# Patient Record
Sex: Male | Born: 1950 | Race: White | Hispanic: No | Marital: Married | Smoking: Never smoker
Health system: Southern US, Academic
[De-identification: ages and names within clinical notes are randomized; demographics above are authoritative.]

## PROBLEM LIST (undated history)

## (undated) DIAGNOSIS — I10 Essential (primary) hypertension: Secondary | ICD-10-CM

## (undated) DIAGNOSIS — Z889 Allergy status to unspecified drugs, medicaments and biological substances status: Secondary | ICD-10-CM

## (undated) DIAGNOSIS — I252 Old myocardial infarction: Secondary | ICD-10-CM

## (undated) DIAGNOSIS — K219 Gastro-esophageal reflux disease without esophagitis: Secondary | ICD-10-CM

## (undated) DIAGNOSIS — E7800 Pure hypercholesterolemia, unspecified: Secondary | ICD-10-CM

## (undated) HISTORY — PX: HX HERNIA REPAIR: SHX51

---

## 1992-05-28 ENCOUNTER — Other Ambulatory Visit (HOSPITAL_COMMUNITY): Payer: Self-pay | Admitting: EXTERNAL

## 2017-05-16 IMAGING — CR XRAY HAND COMPLETE LT
1 series · 3 of 3 positions shown · non-contrast
Comparison: None.

EXAM:  XRAY HAND COMPLETE LT 3V

EXAM: XRAY EXAM OF WRIST COMPLETE RT 3v
EXAM: XRAY WRIST COMPLETE LT 3v 
EXAM: XRAY HAND COMPLETE RT 3V
INDICATION: Pain.

[Series 1: view not recorded · 0.17mm/px · 3 of 3 slices shown]
[im 1/3]
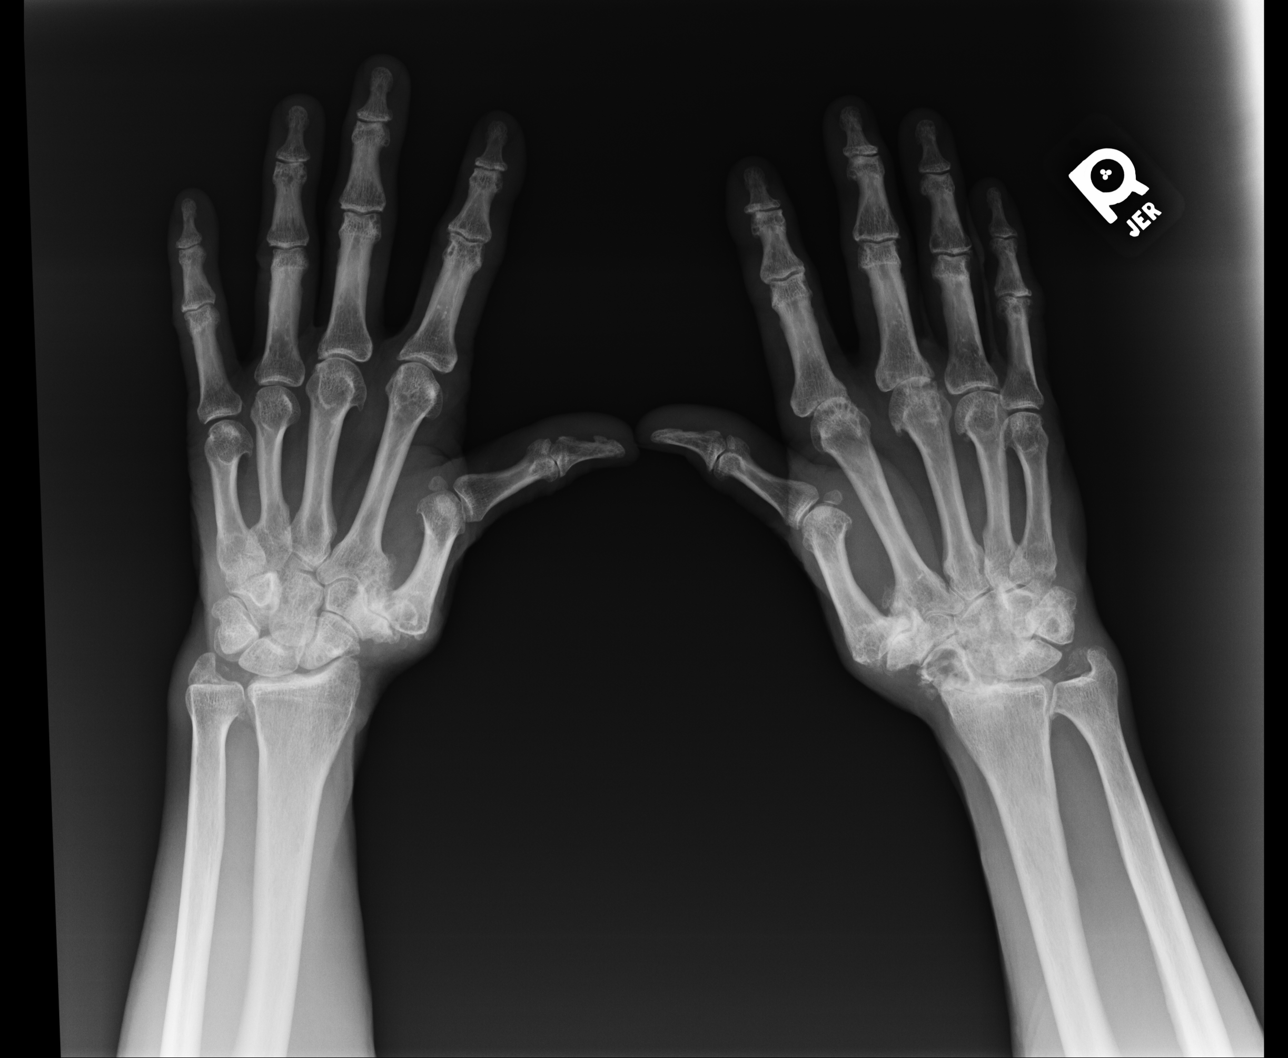
[im 2/3]
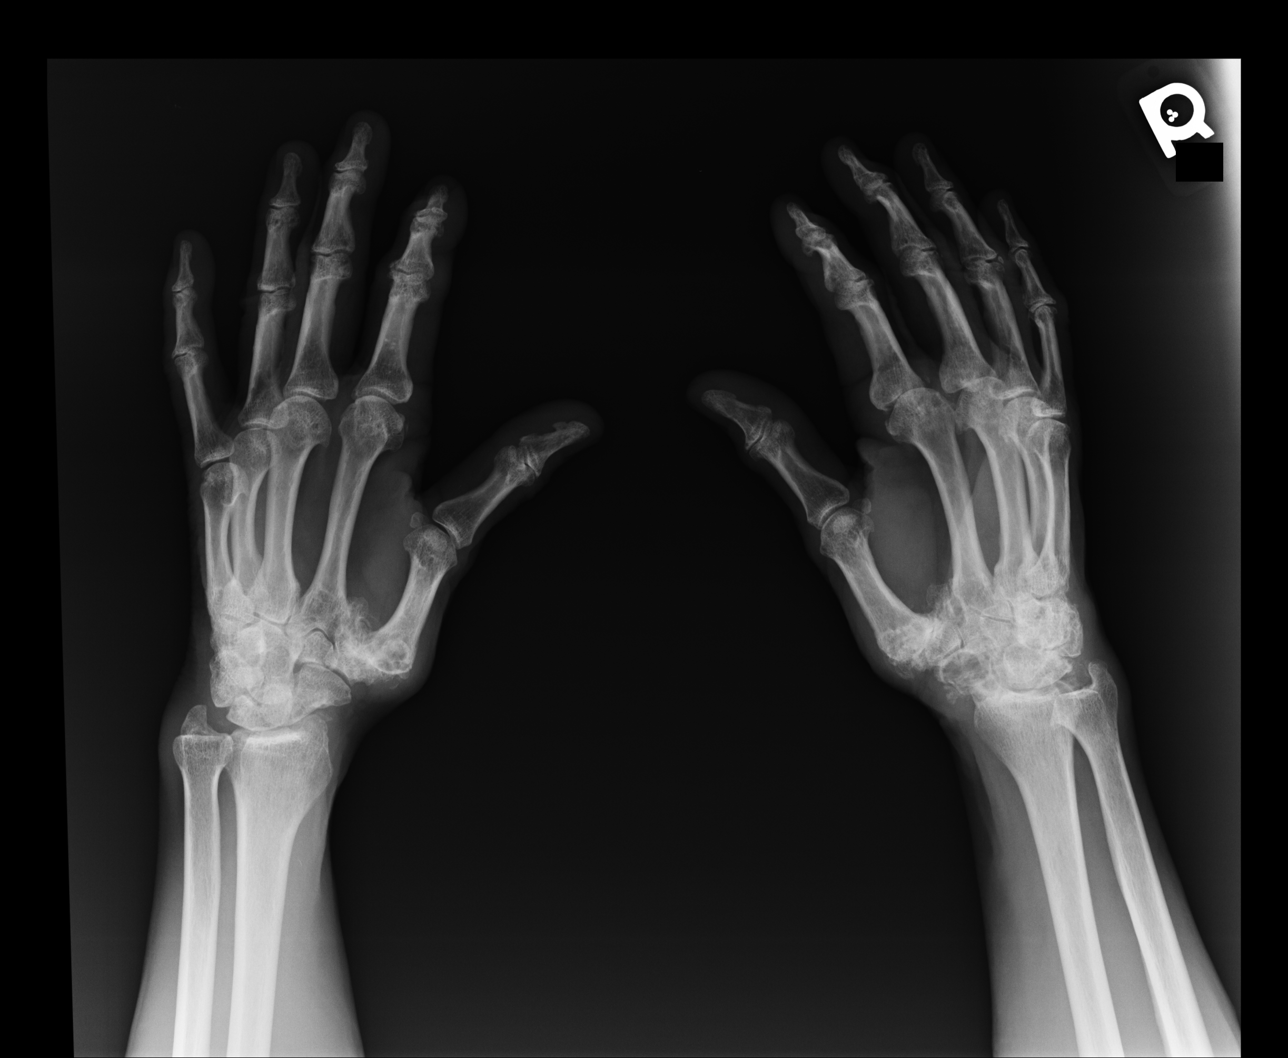
[im 3/3]
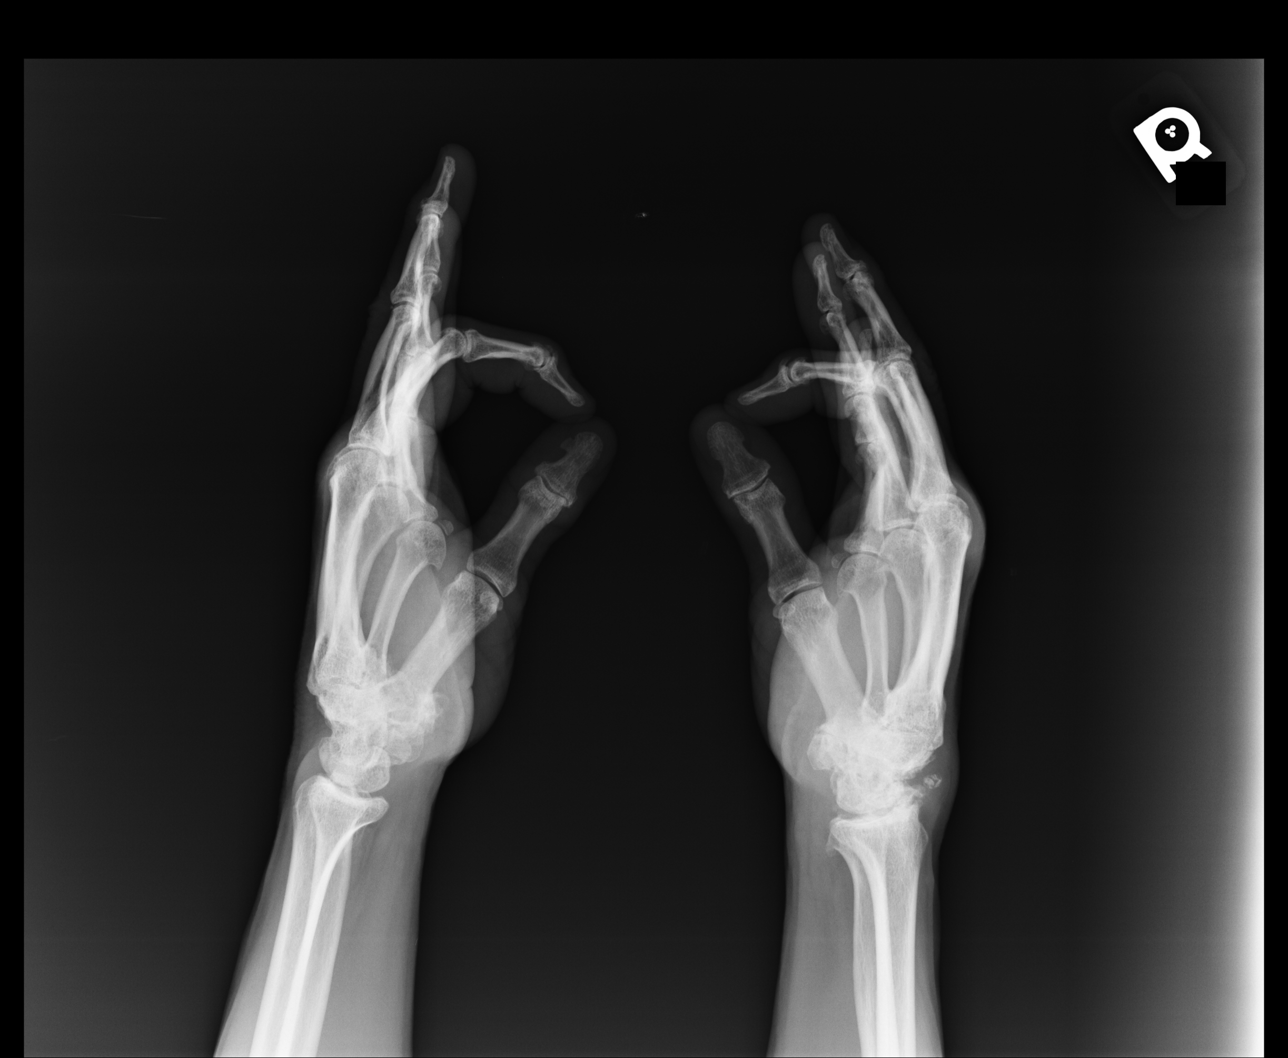

[3 of 3 positions shown; findings below may reference images not displayed]

FINDINGS: There is no acute fracture or subluxation. There is severe osteoarthritis of the right wrist joint. Severe osteoarthritis of the first carpometacarpal joints is also seen bilaterally. There is also advanced osteoarthritis of the right second through fourth metacarpophalangeal joints. Mild osteoarthritis of the left second and third metacarpophalangeal joints is also seen. There is no soft tissue abnormality. Faint calcifications of the triangular fibrocartilage are suggestive of chondrocalcinosis.
IMPRESSION: Degenerative changes as detailed above, no acute osseous abnormality.

## 2017-05-16 IMAGING — CR XRAY FOOT COMPLETE LT
1 series · 6 of 6 positions shown · non-contrast
Comparison: None.

EXAM:  XRAY FOOT COMPLETE LT 3V

EXAM: XRAY FOOT COMPLETE RT  3V
INDICATION: Pain.

[Series 1: view not recorded · 0.17mm/px · 6 of 6 slices shown]
[im 1/6]
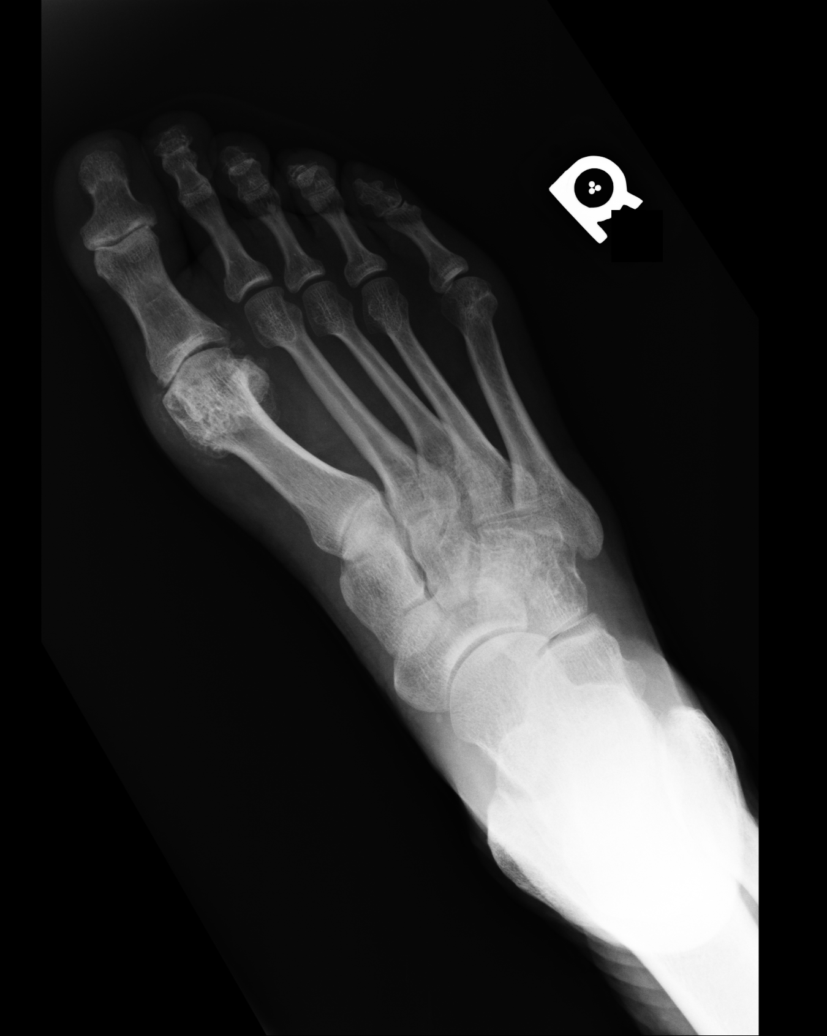
[im 2/6]
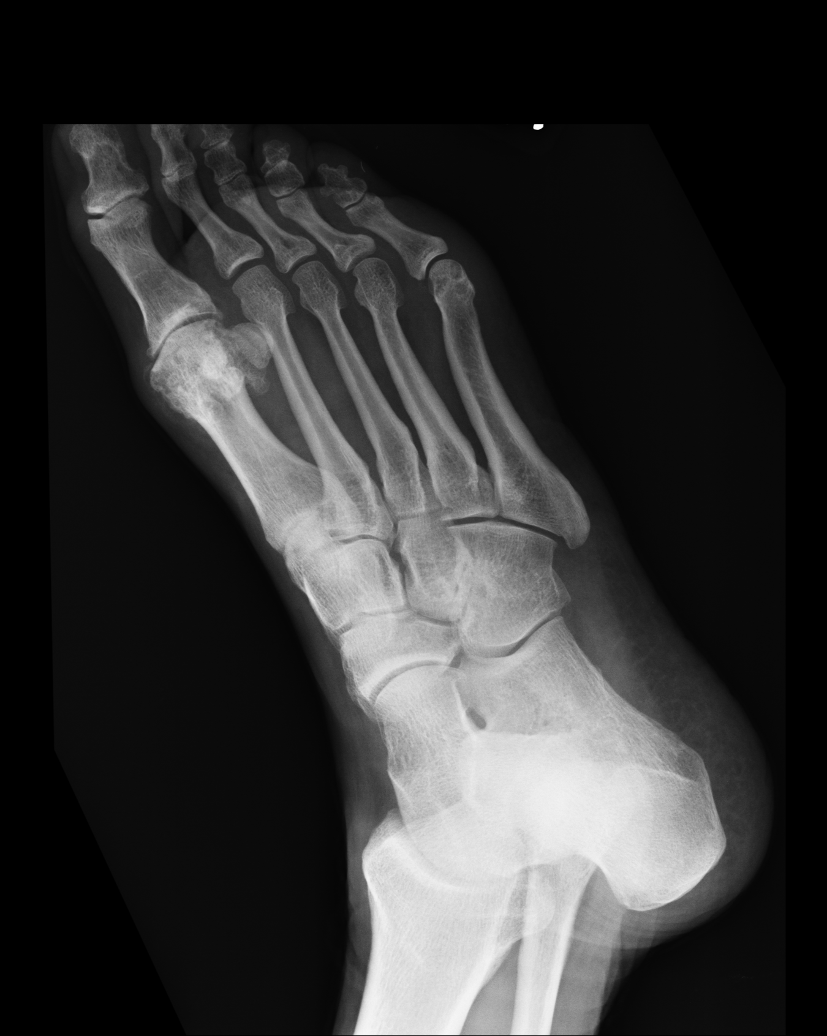
[im 3/6]
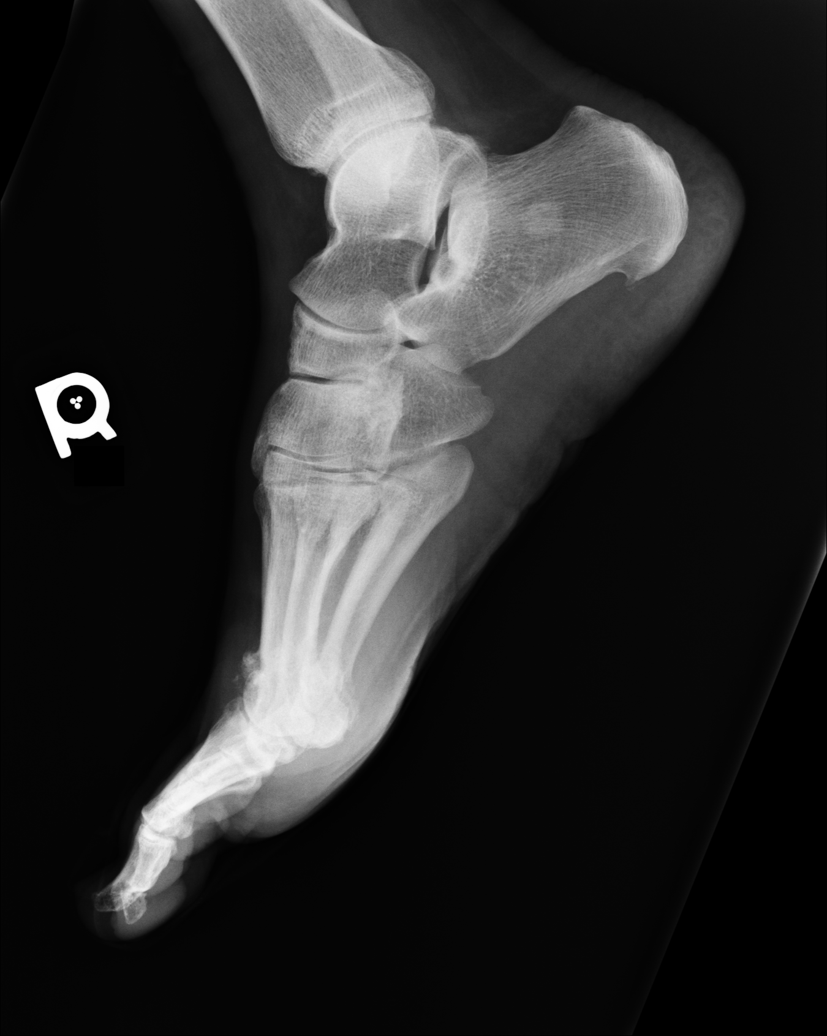
[im 4/6]
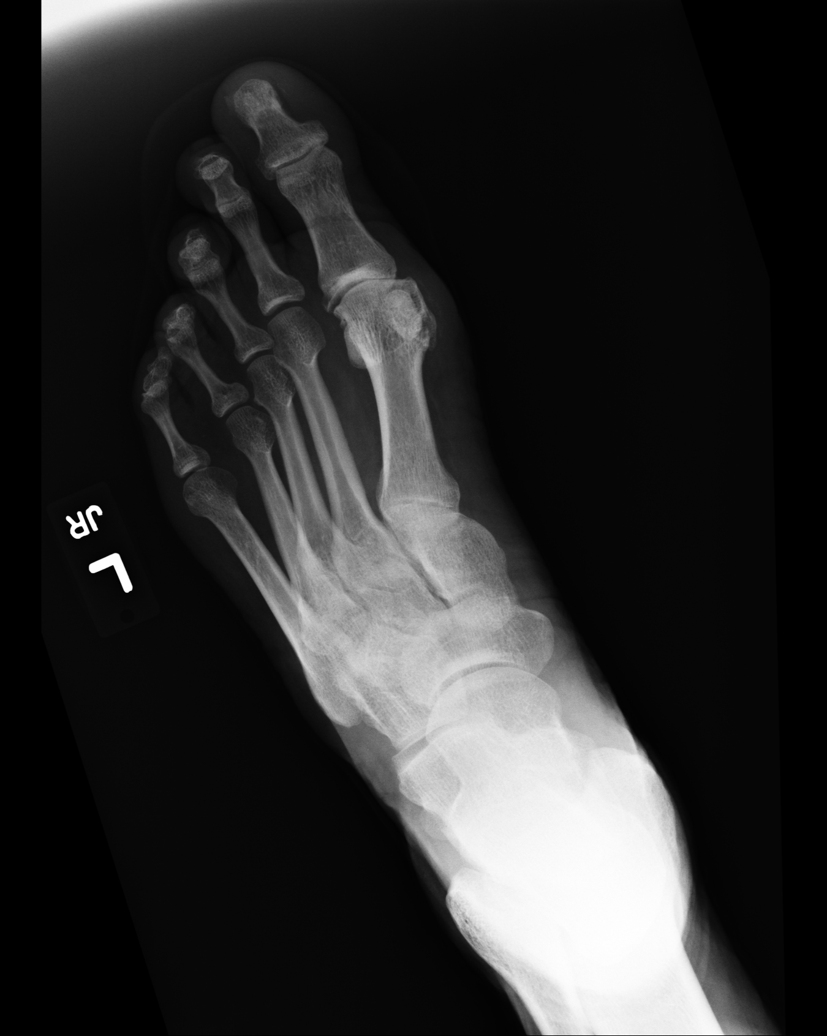
[im 5/6]
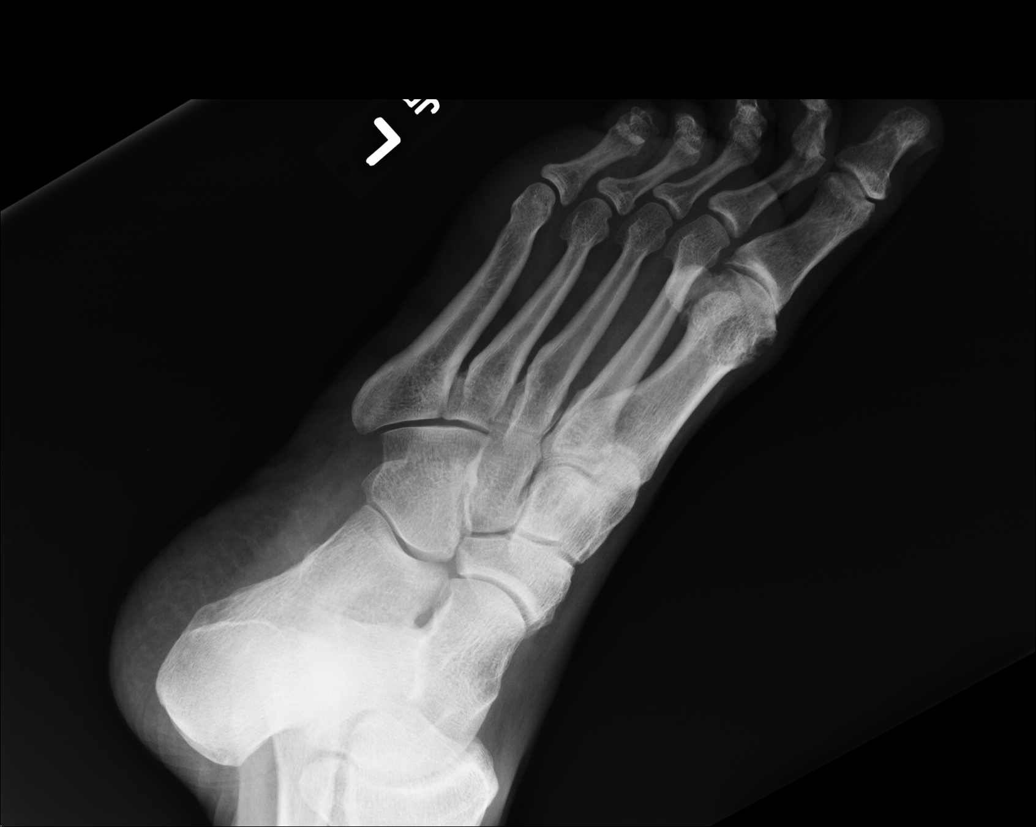
[im 6/6]
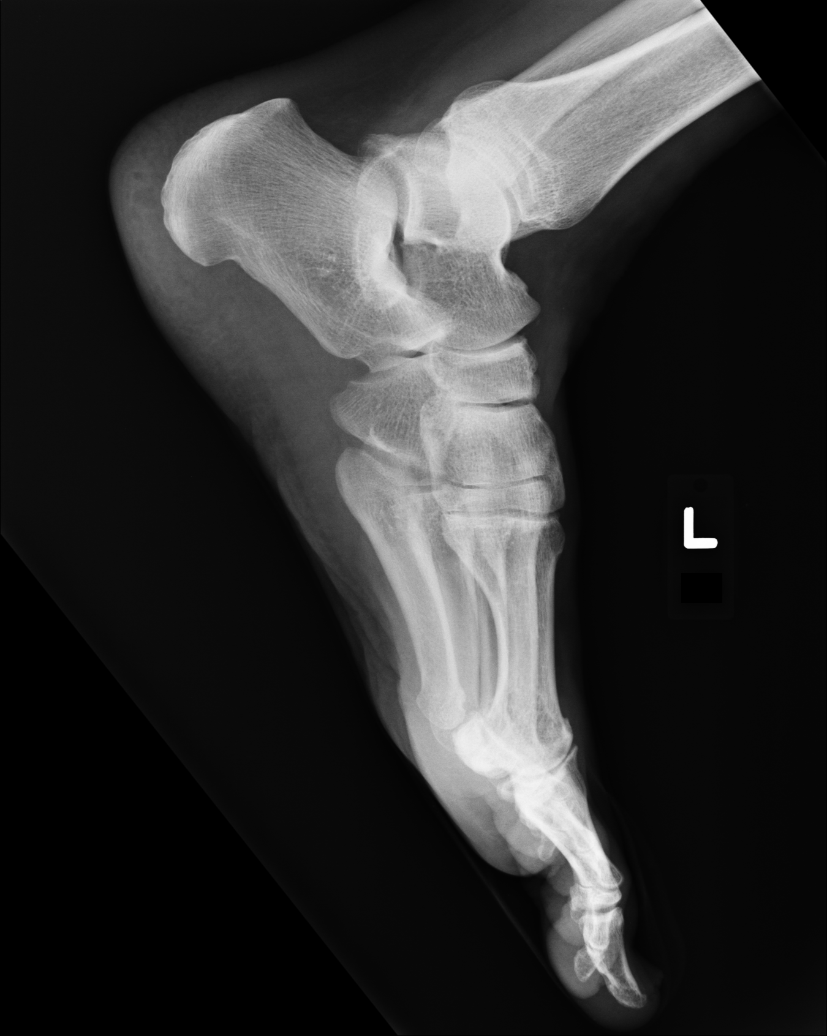

[6 of 6 positions shown; findings below may reference images not displayed]

FINDINGS: There is no acute fracture or subluxation. There is moderate osteoarthritis of the first metatarsophalangeal joints bilaterally. Mild midfoot osteoarthritis is also seen on both sides. There is no soft tissue abnormality.
IMPRESSION: Osteoarthritis of both feet as detailed above.

## 2017-05-16 IMAGING — CR XRAY KNEE [DATE] VIEWS LT
1 series · 3 of 3 positions shown · non-contrast
Comparison: None.

EXAM:  XRAY KNEE [DATE] VIEWS LT

EXAM:
 XRAY KNEE [DATE] VIEWS RT
INDICATION: Pain.

[Series 1: view not recorded · 0.17mm/px · 3 of 3 slices shown]
[im 1/3]
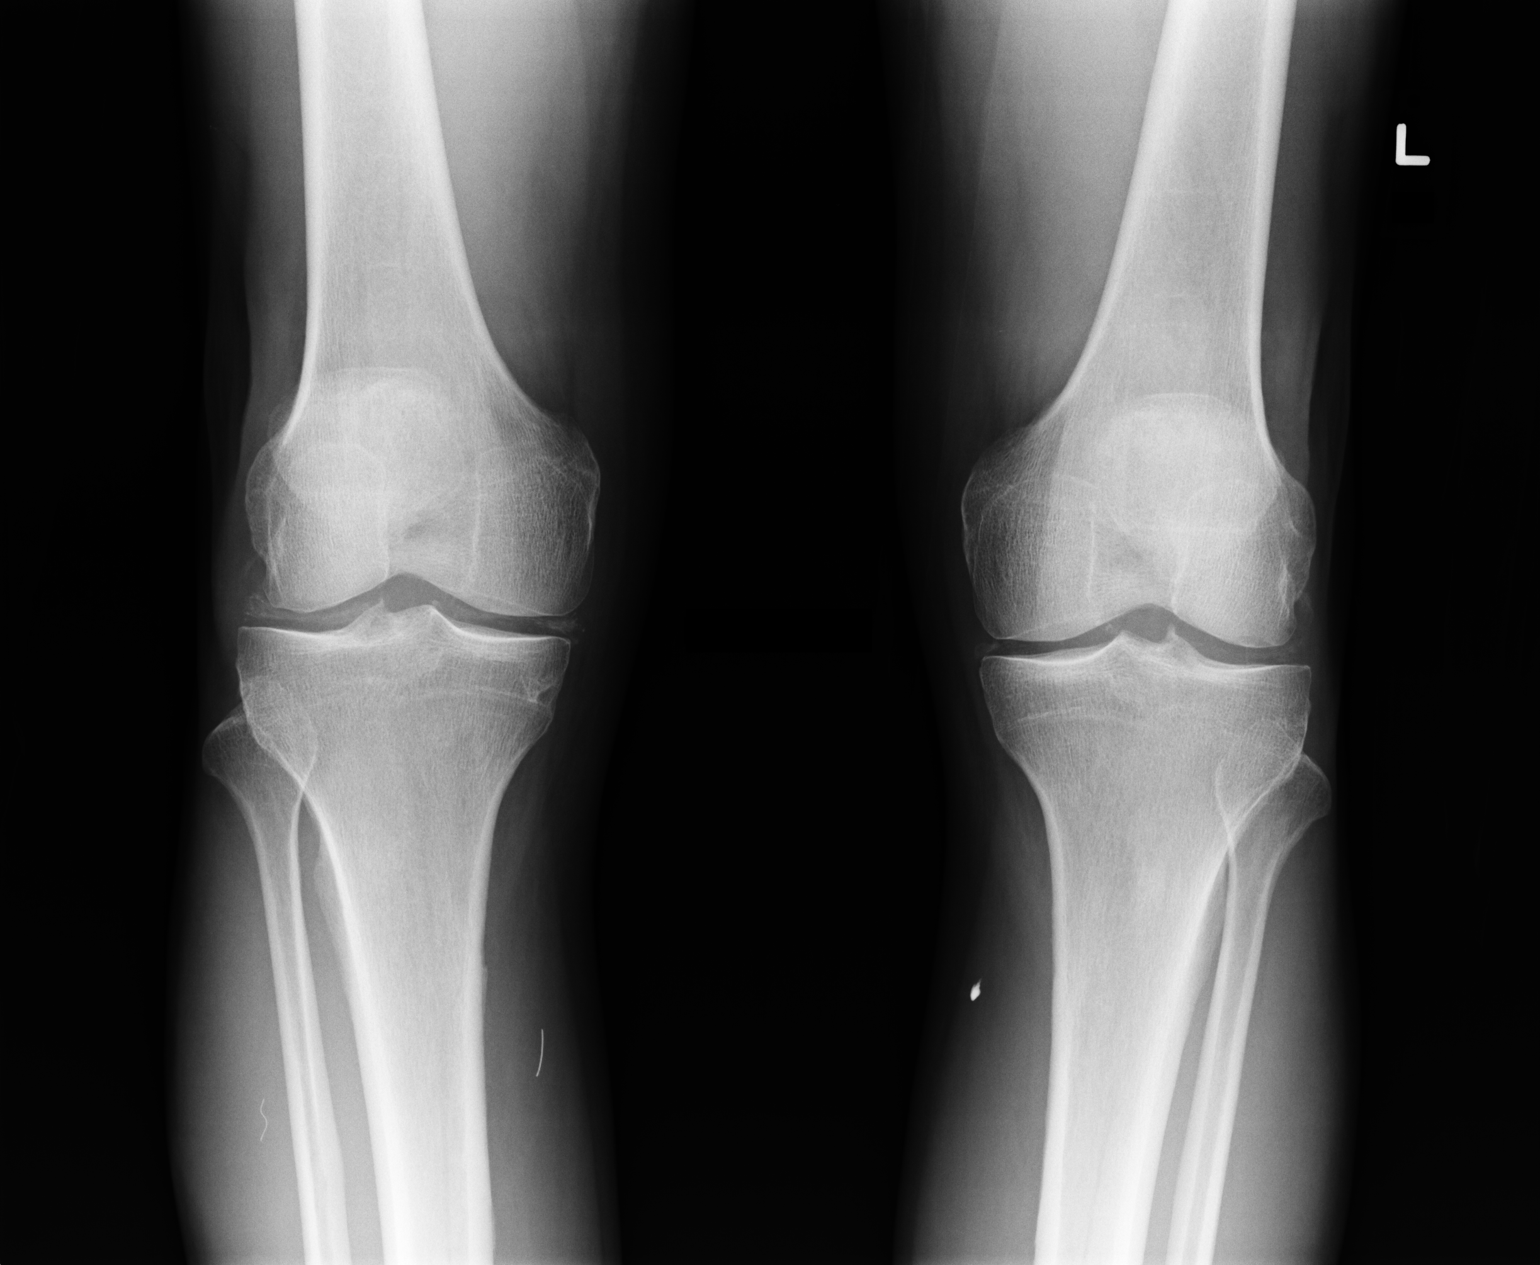
[im 2/3]
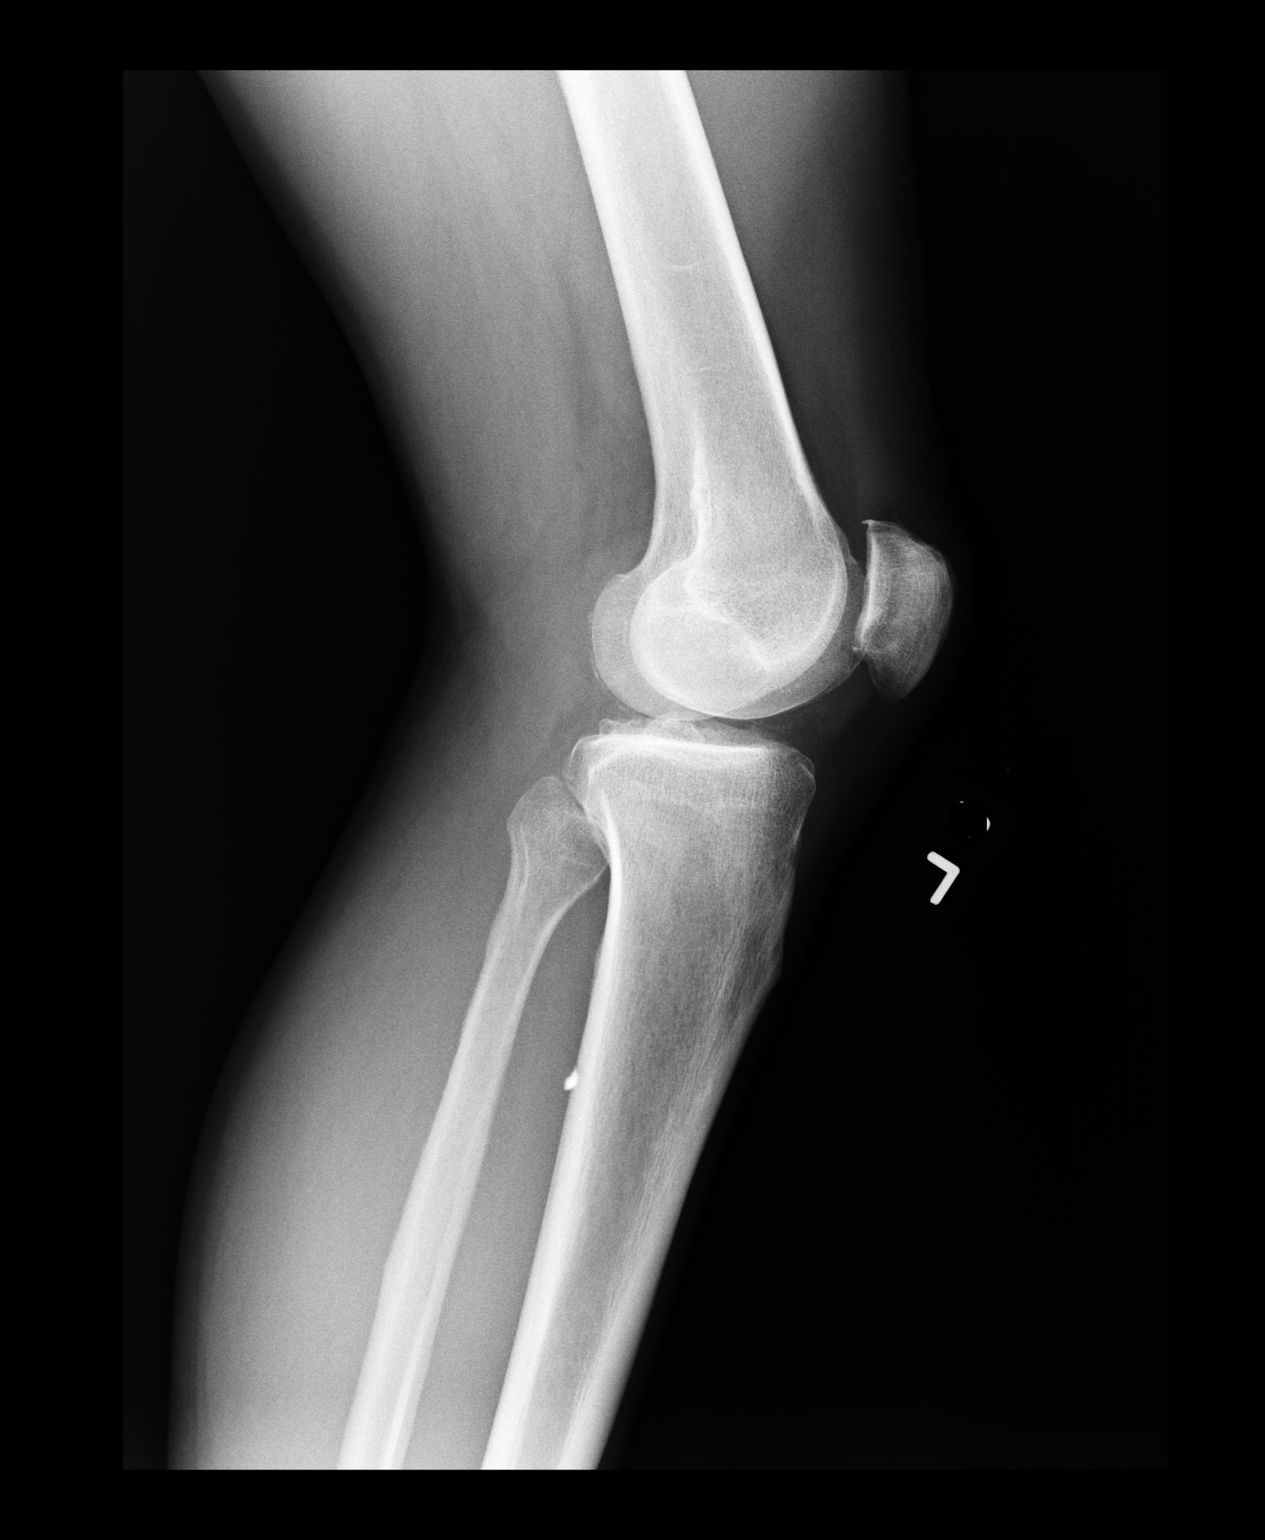
[im 3/3]
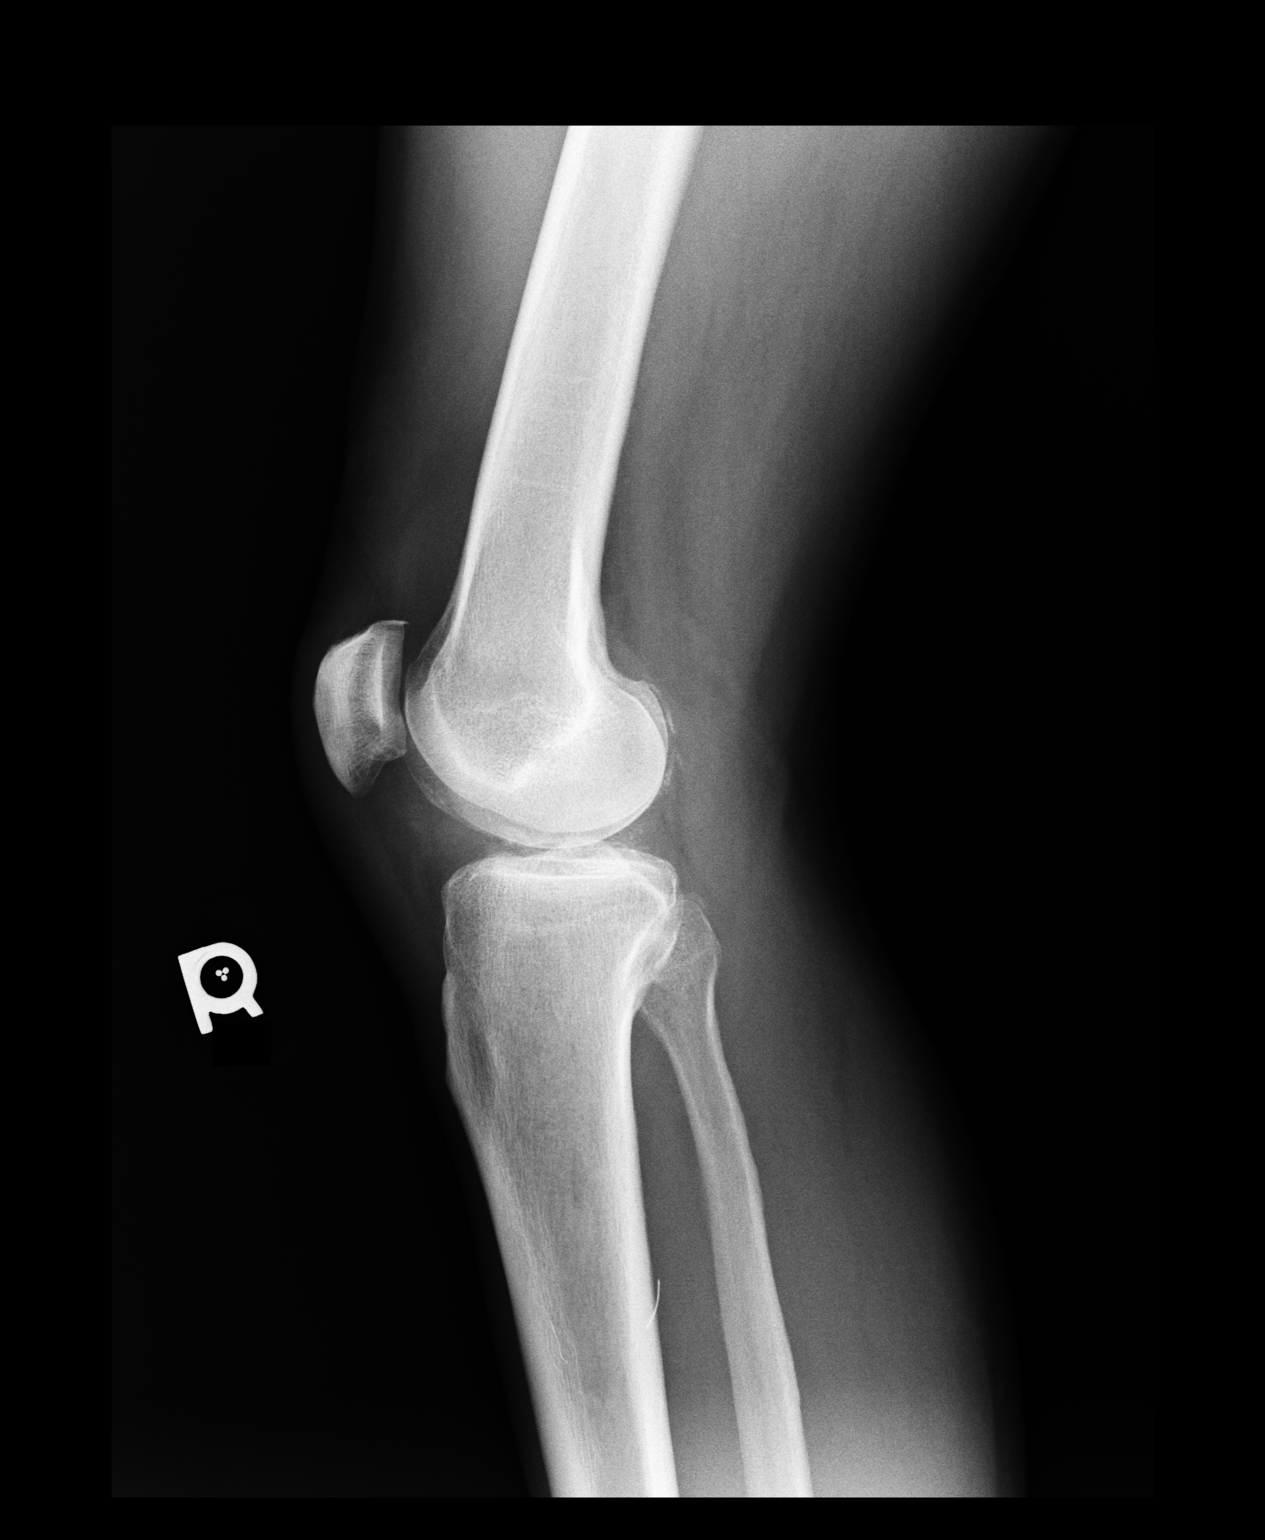

[3 of 3 positions shown; findings below may reference images not displayed]

FINDINGS: There is no acute fracture or subluxation. Joint spaces are well maintained bilaterally. Meniscal calcifications are seen on both sides consistent with chondrocalcinosis. There is no suprapatellar effusion on either side. There is suggestion of subcentimeter metallic foreign bodies within the soft tissues of both proximal calf’s. There is no suprapatellar effusion on either side.
IMPRESSION: Chondrocalcinosis, no significant joint space narrowing. 

Suggestion of subcentimeter metallic foreign bodies within soft tissues of both calf’s.

## 2017-05-16 IMAGING — CR XRAY LUMBAR SPINE COMPLETE
1 series · 4 of 4 positions shown · non-contrast
Comparison: None.

EXAM:  XRAY LUMBAR SPINE COMPLETE 4V
INDICATION: Back pain.

[Series 1: view not recorded · 0.17mm/px · 4 of 4 slices shown]
[im 1/4]
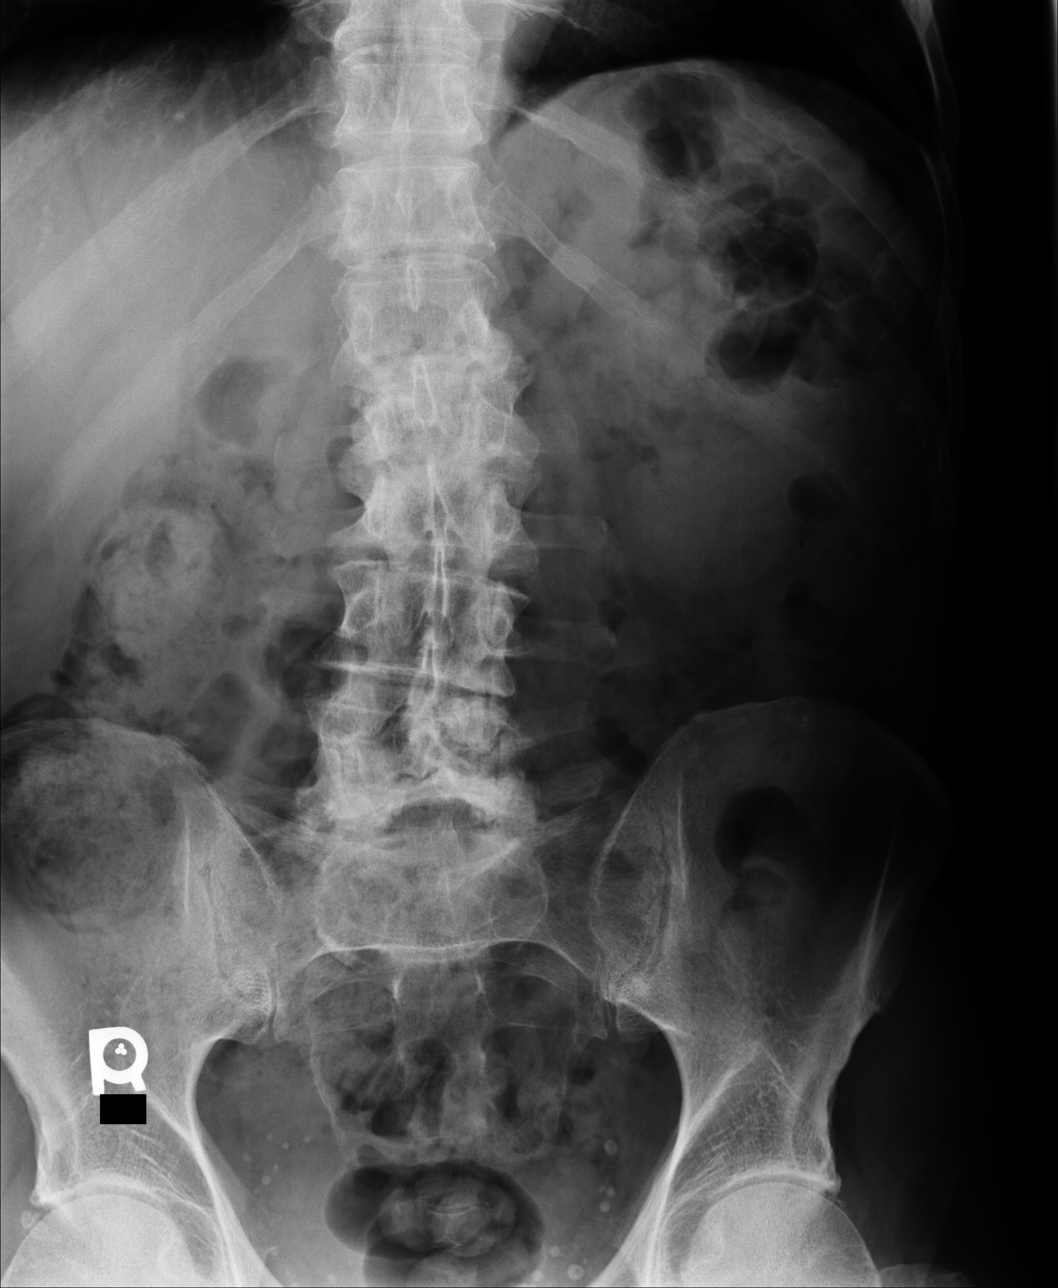
[im 2/4]
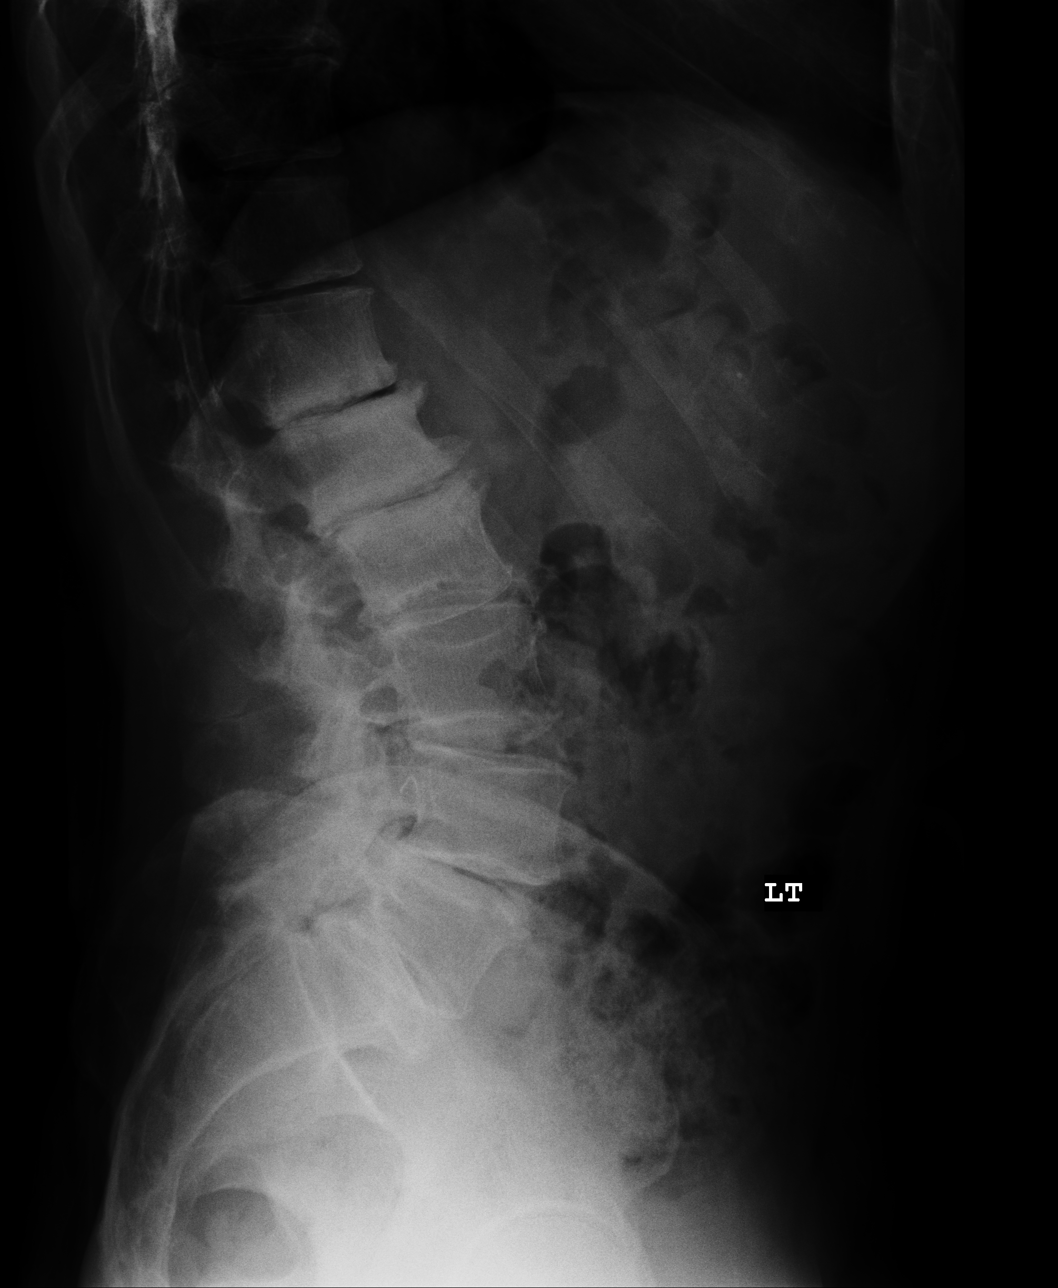
[im 3/4]
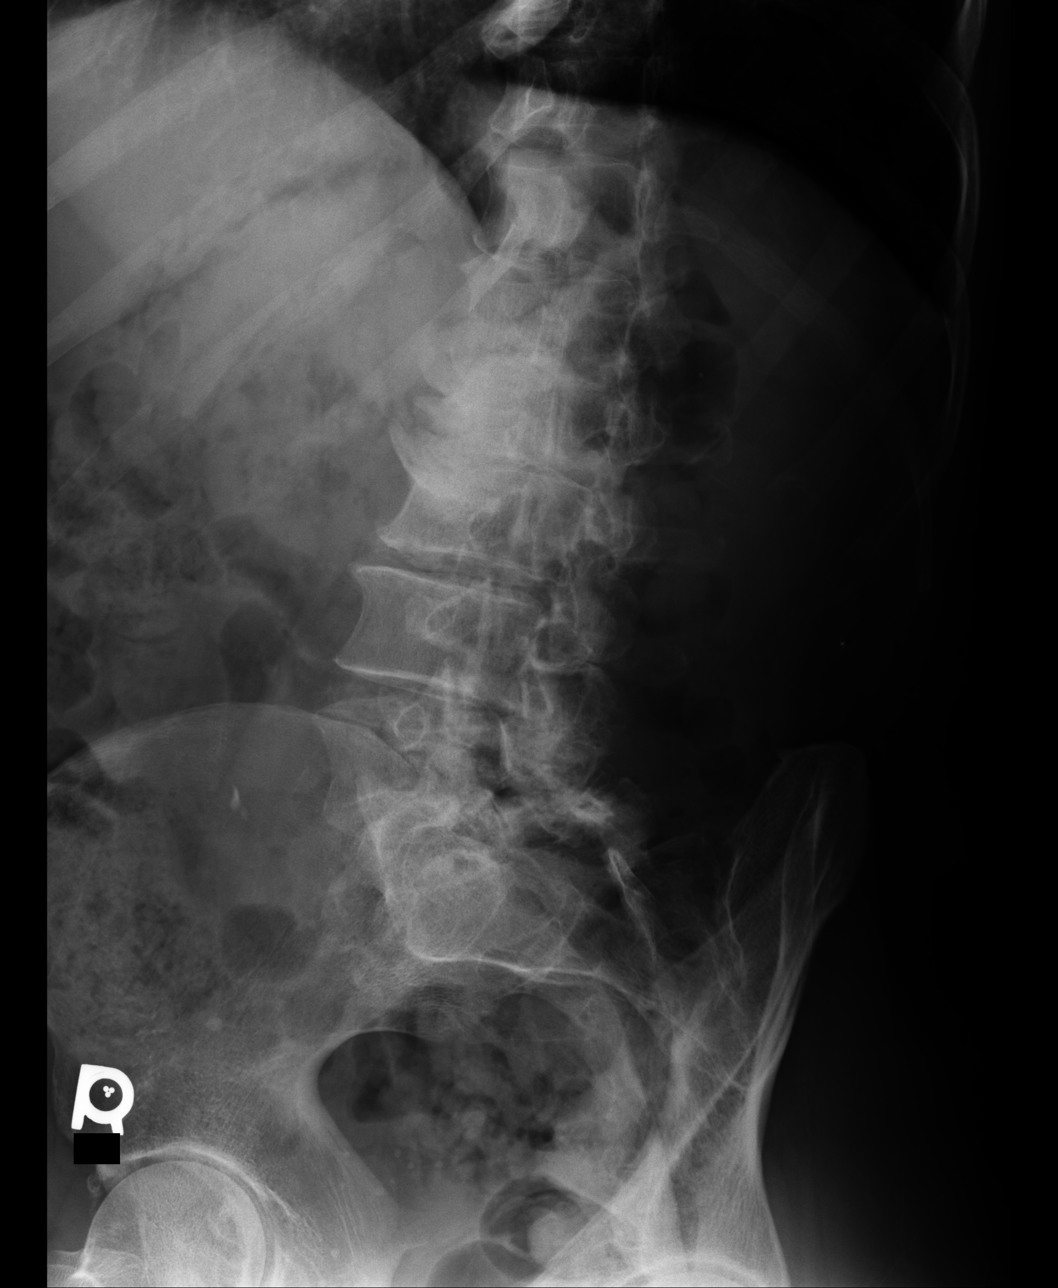
[im 4/4]
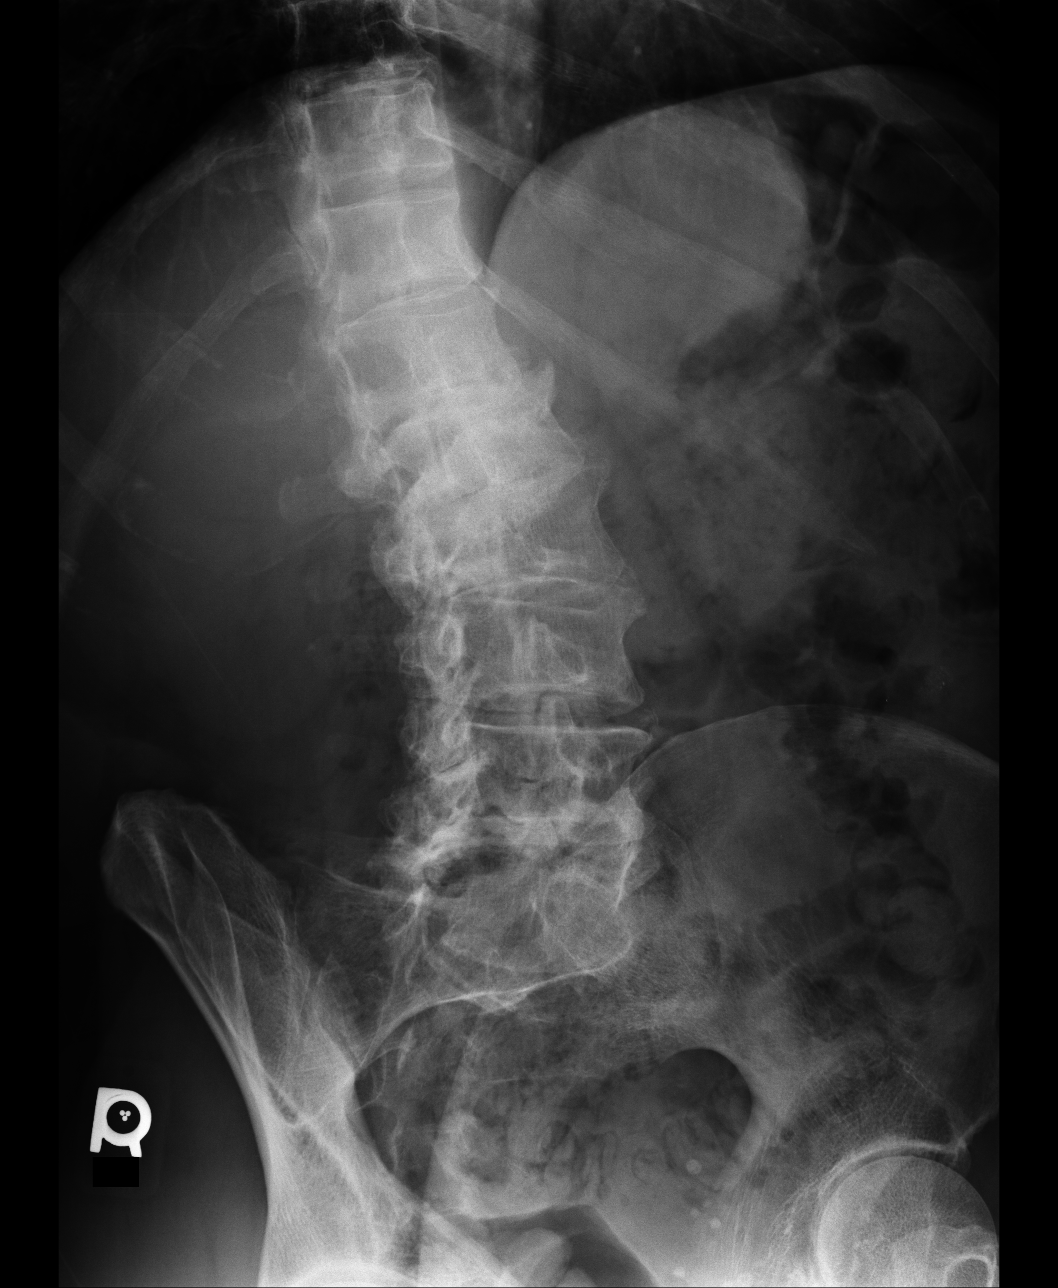

[4 of 4 positions shown; findings below may reference images not displayed]

FINDINGS: There is no acute fracture or subluxation. Severe degenerative disc disease is seen at most levels within the lumbar spine. There is also extensive facet arthropathy within the lower lumbar spine. There is no definite pars defect on the oblique views. Paraspinal soft tissues are unremarkable.
IMPRESSION: Extensive multi level degenerative changes as detailed above.

## 2017-05-16 IMAGING — CR XRAY ELBOW COMPLETE LT
1 series · 3 of 3 positions shown · non-contrast
Comparison: None.

EXAM:  XRAY ELBOW COMPLETE LT 3V
INDICATION: Pain.

[Series 1: view not recorded · 0.17mm/px · 3 of 3 slices shown]
[im 1/3]
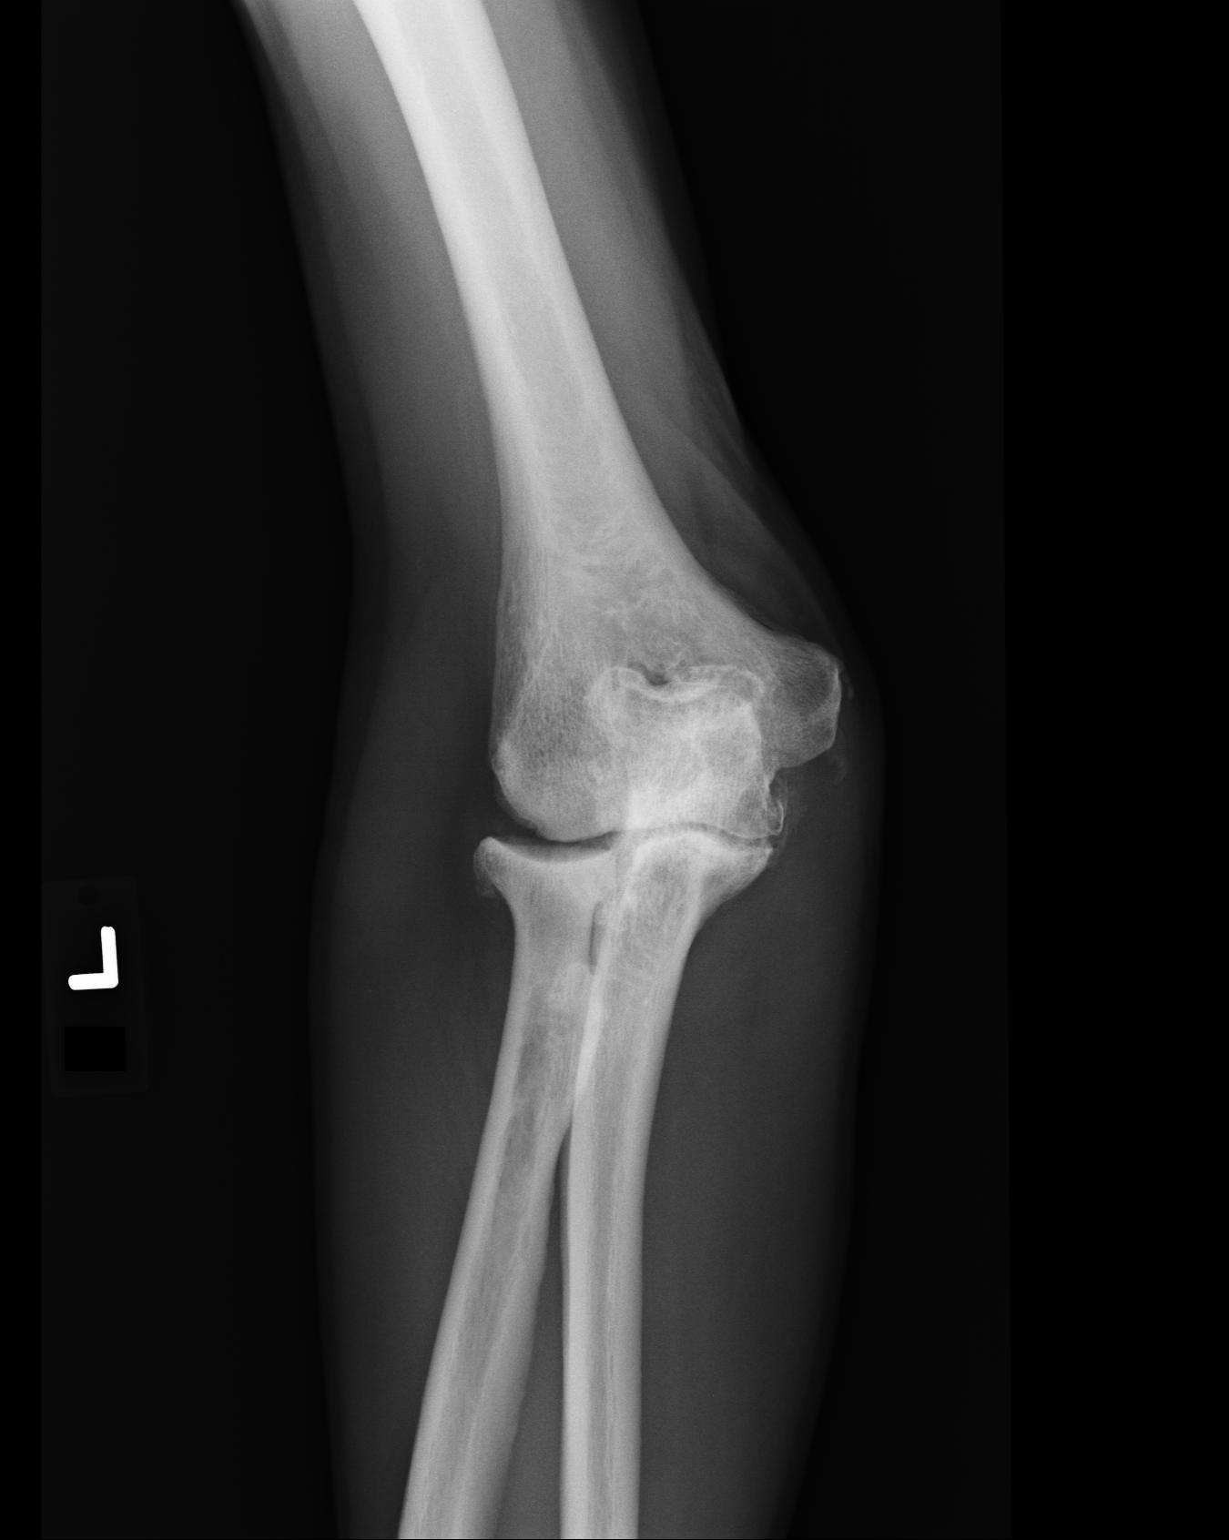
[im 2/3]
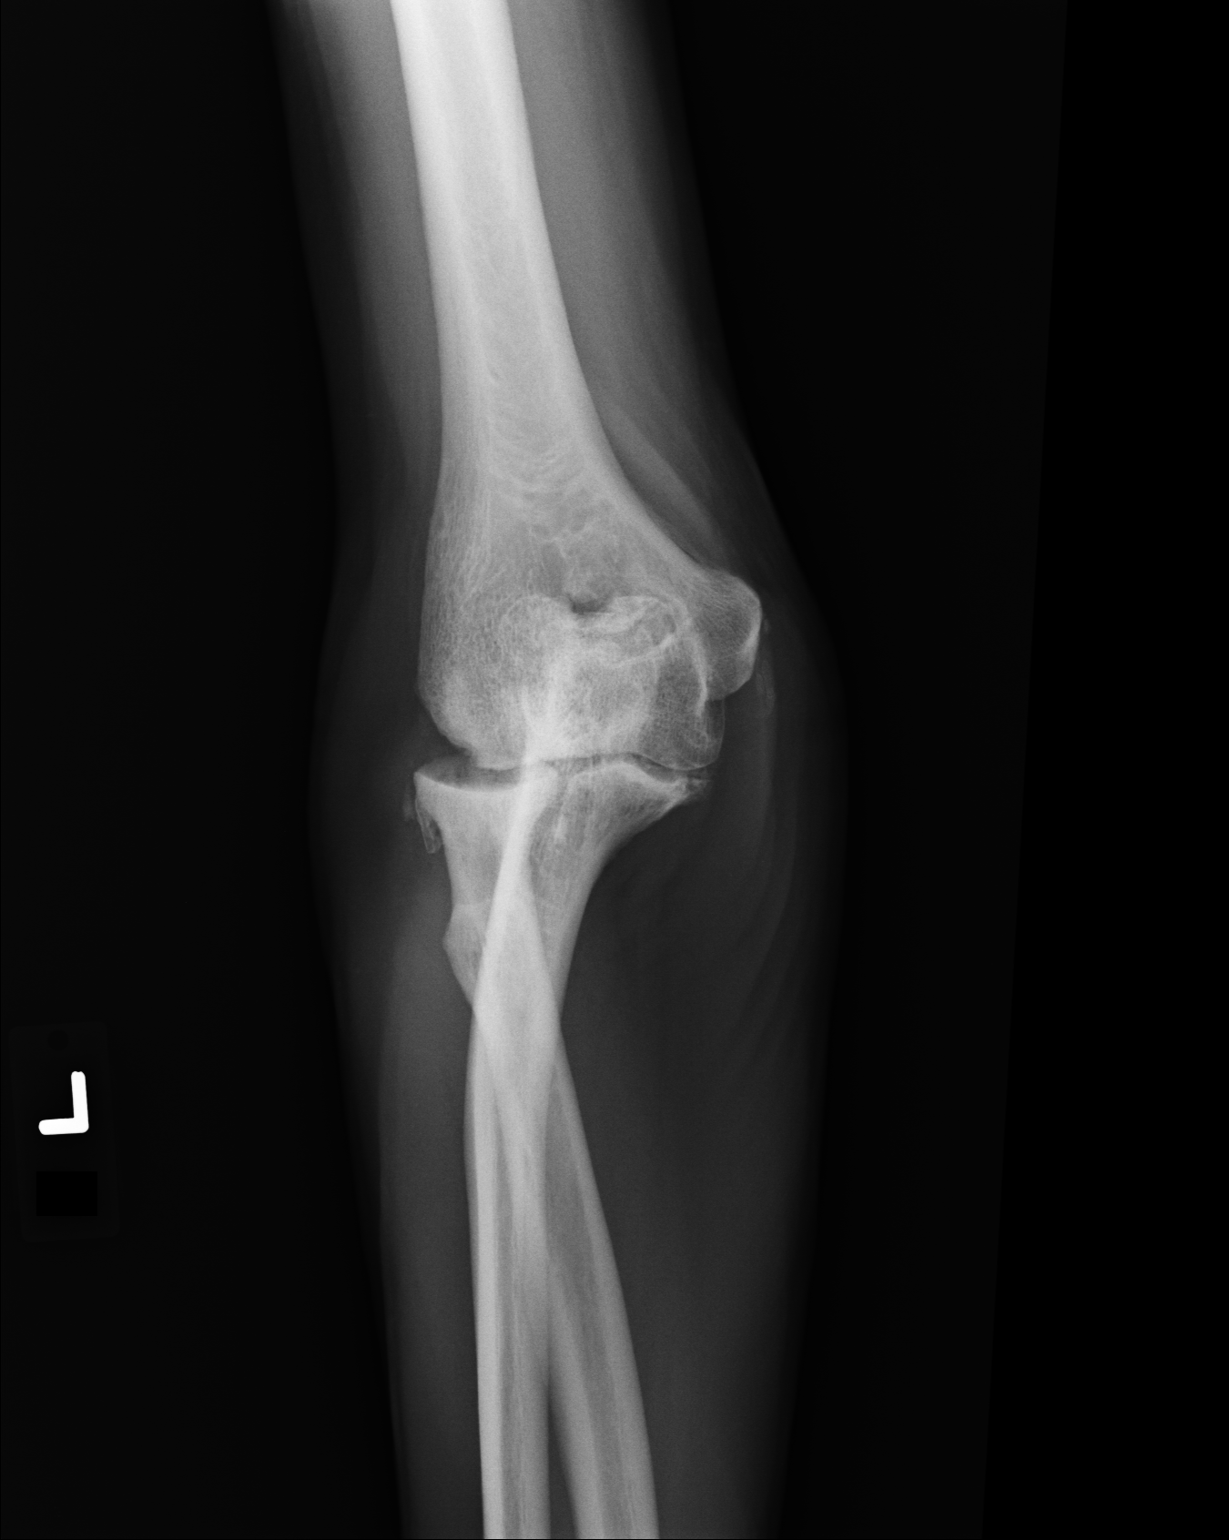
[im 3/3]
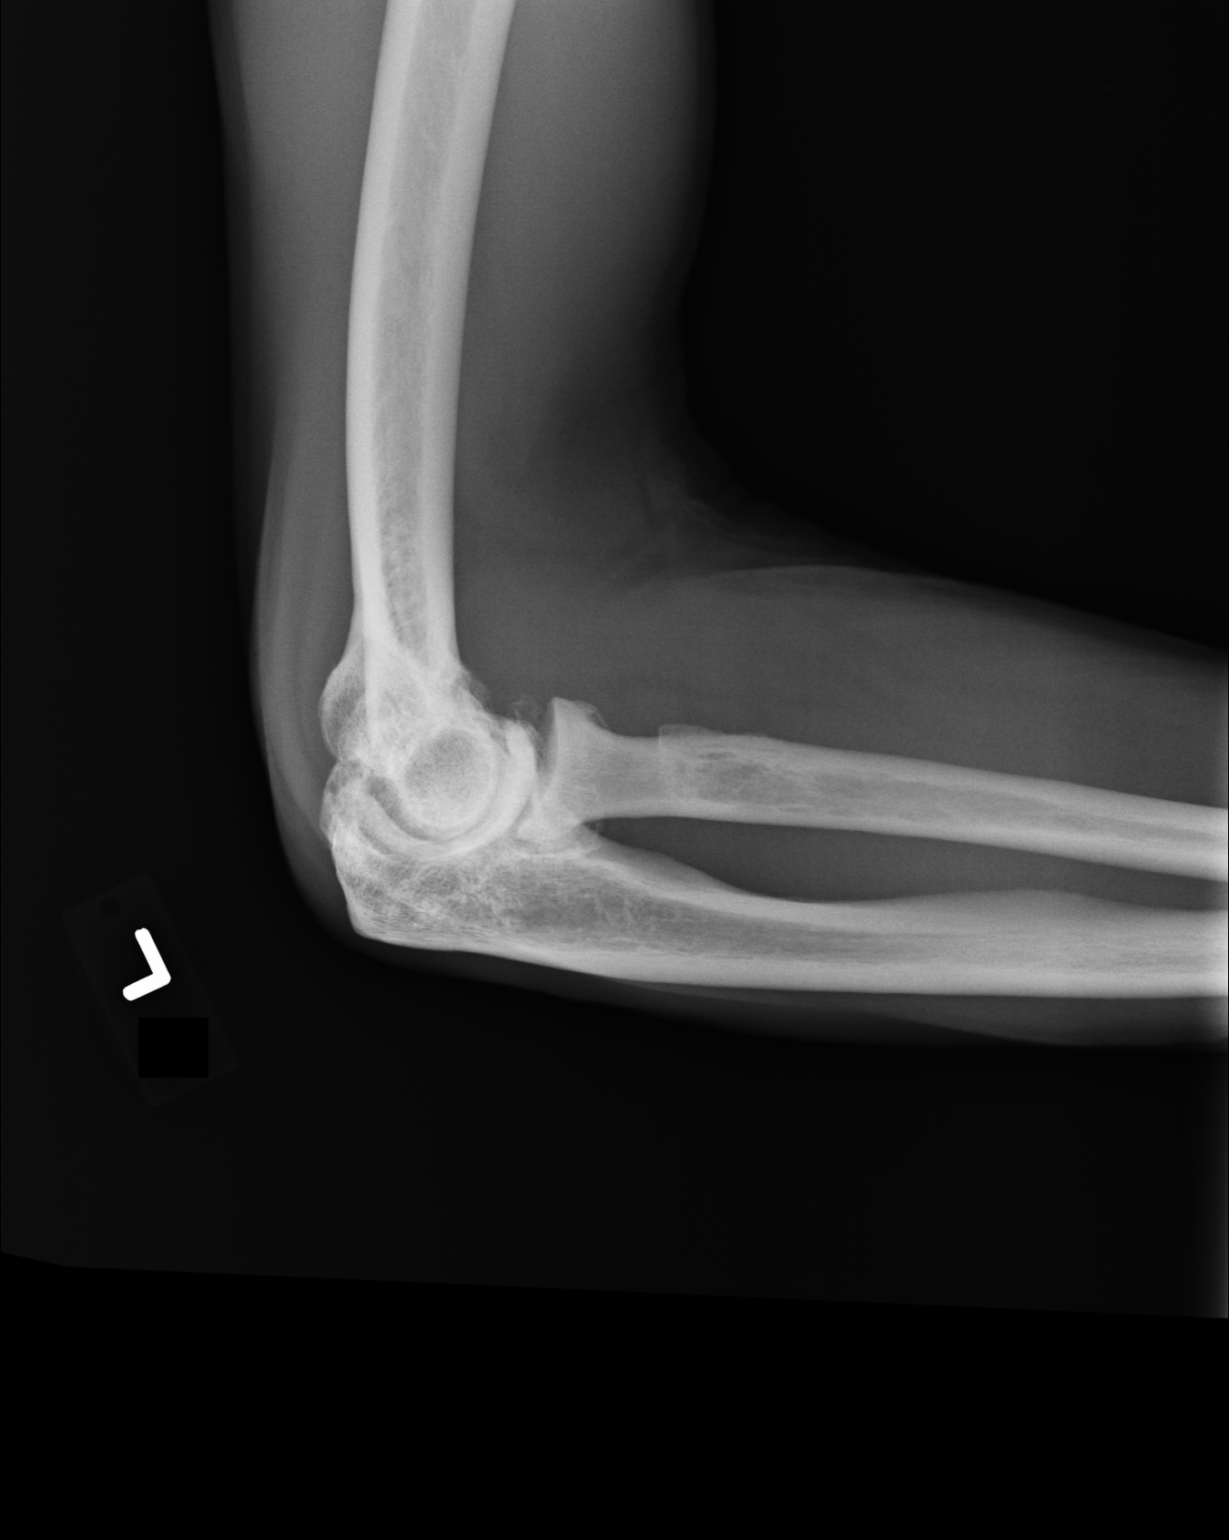

[3 of 3 positions shown; findings below may reference images not displayed]

FINDINGS: There is no acute fracture or subluxation. There is moderate osteoarthritis of the elbow joint with a small associated joint effusion. Surrounding soft tissues are unremarkable.
IMPRESSION: Moderate osteoarthritis, no acute osseous abnormality.

## 2017-05-16 IMAGING — CR XRAY CERVICAL SPINE MINIMUM 4 VIEWS
1 series · 8 of 8 positions shown · non-contrast
Comparison: None.

EXAM:  XRAY CERVICAL SPINE 5 VIEWS
INDICATION: Pain.

[Series 1: view not recorded · 0.17mm/px · 8 of 8 slices shown]
[im 1/8]
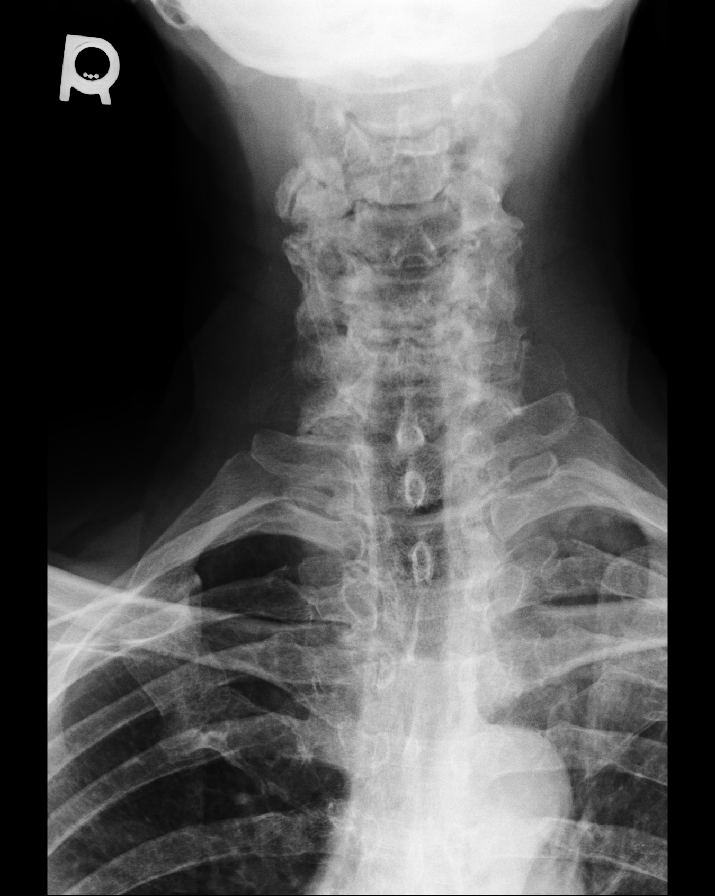
[im 2/8]
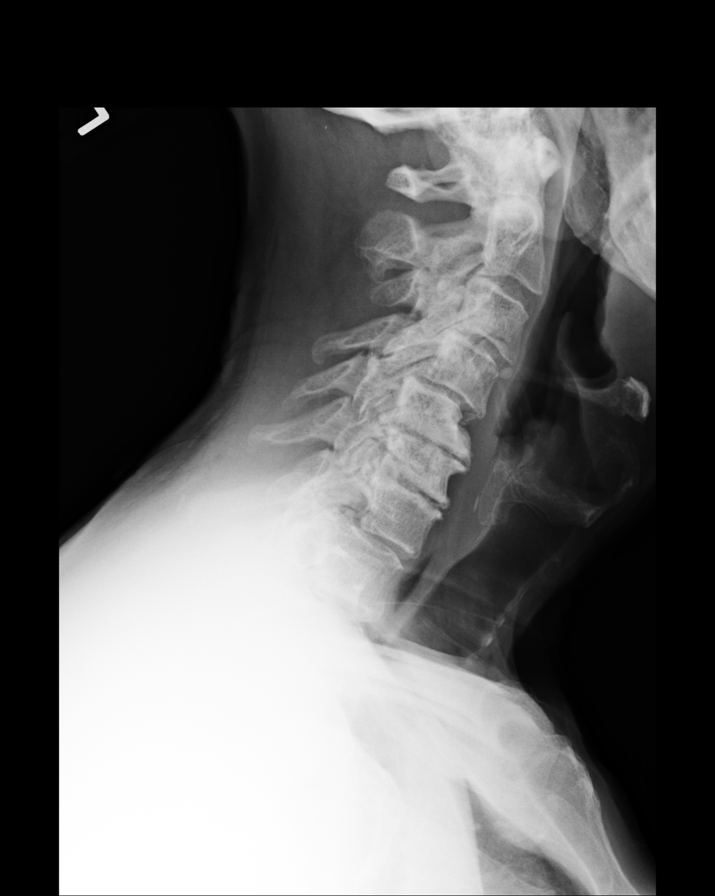
[im 3/8]
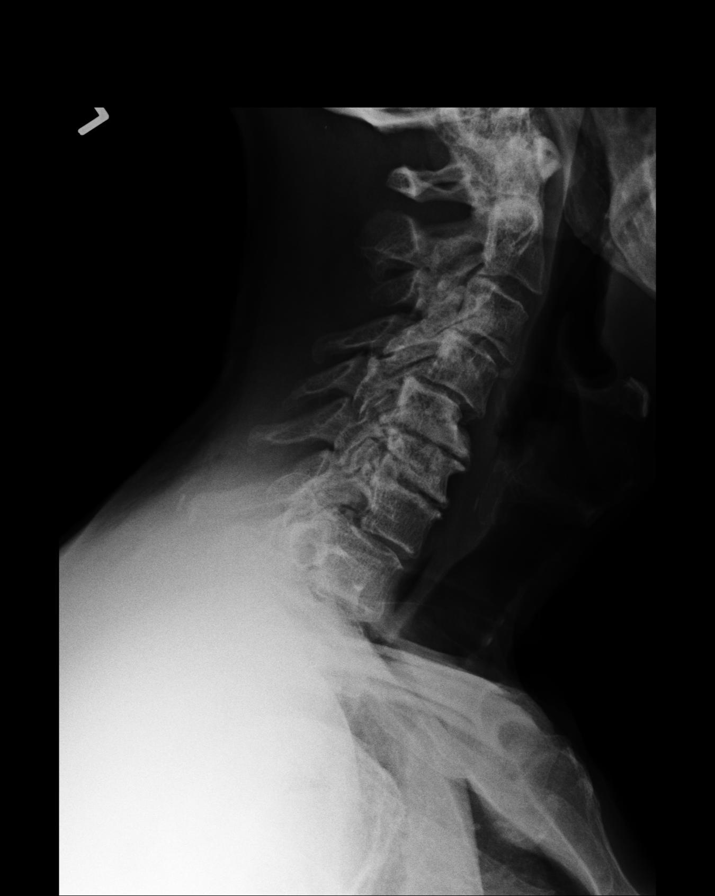
[im 4/8]
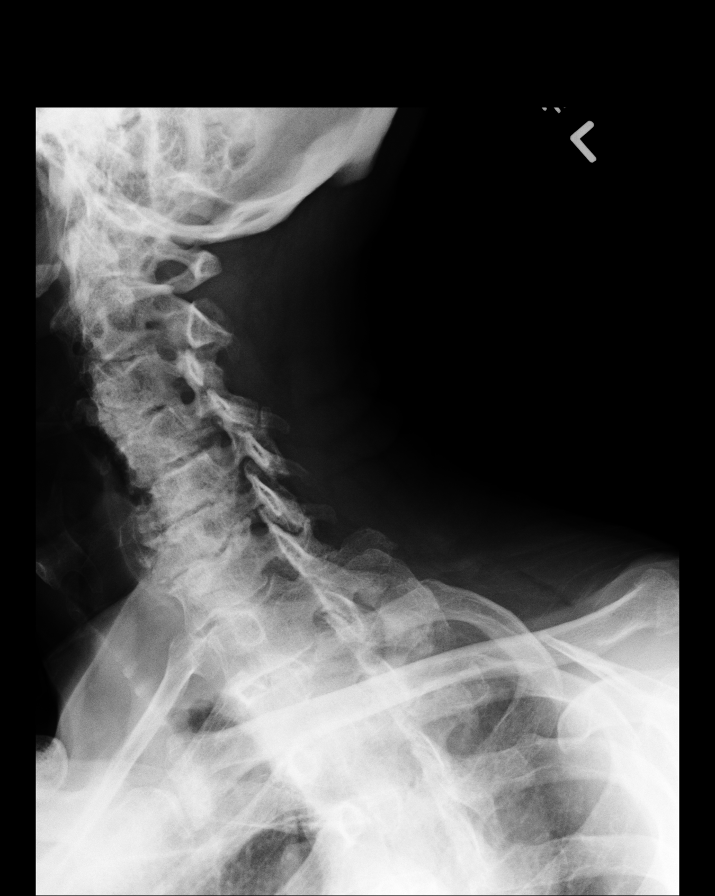
[im 5/8]
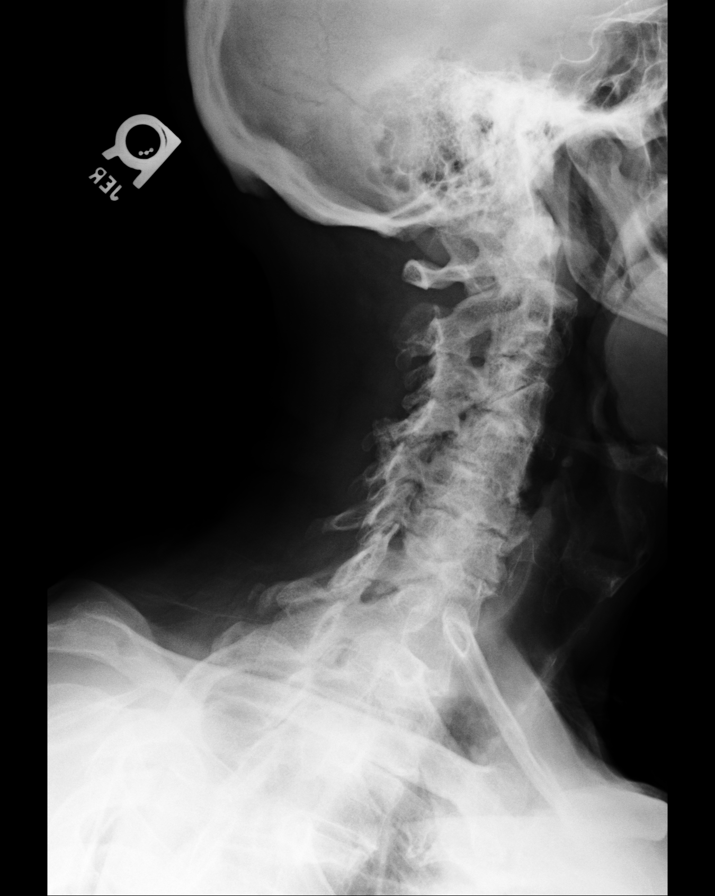
[im 6/8]
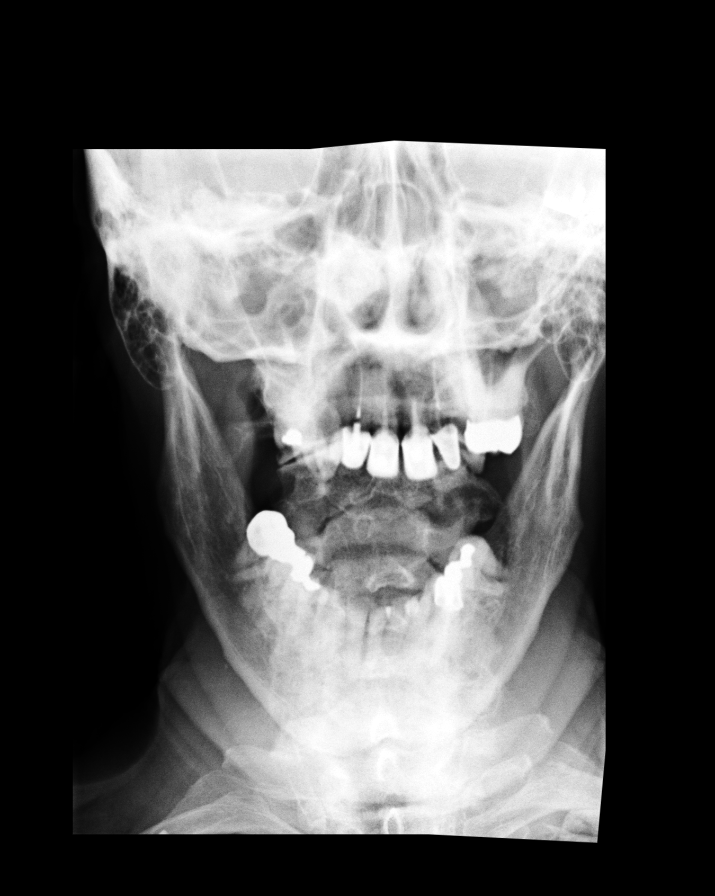
[im 7/8]
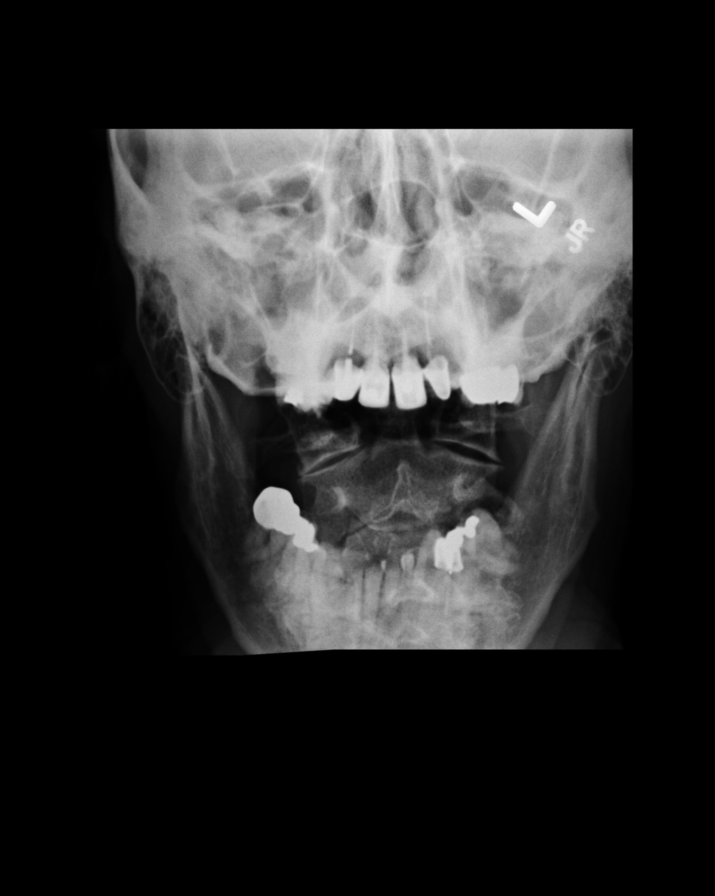
[im 8/8]
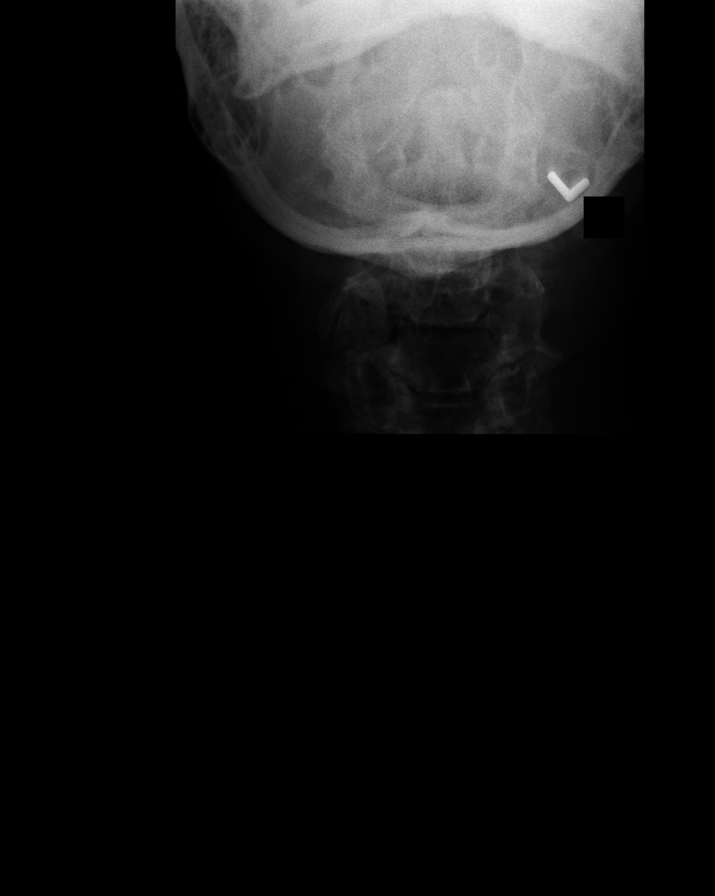

[8 of 8 positions shown; findings below may reference images not displayed]

FINDINGS: There is no acute fracture or subluxation. Moderate to severe degenerative disc disease is seen at most levels. Minimal anterolisthesis of C4 on C5 and C7 on T1 vertebral body is likely degenerative. Moderate to severe neural foraminal stenosis is also seen at multiple levels bilaterally from facet arthropathy and uncovertebral joint hypertrophy. Atlanto-axial articulation appears well maintained. There is no prevertebral soft tissue swelling.
IMPRESSION: Extensive multi level degenerative changes as detailed above.

## 2022-03-06 ENCOUNTER — Encounter (HOSPITAL_BASED_OUTPATIENT_CLINIC_OR_DEPARTMENT_OTHER): Payer: Self-pay

## 2022-03-06 ENCOUNTER — Emergency Department
Admission: EM | Admit: 2022-03-06 | Discharge: 2022-03-06 | Disposition: A | Discharge status: Home / Self Care | Payer: Medicare (Managed Care) | Attending: Emergency Medicine | Admitting: Emergency Medicine

## 2022-03-06 ENCOUNTER — Other Ambulatory Visit: Payer: Self-pay

## 2022-03-06 DIAGNOSIS — Y93H2 Activity, gardening and landscaping: Secondary | ICD-10-CM | POA: Insufficient documentation

## 2022-03-06 DIAGNOSIS — H109 Unspecified conjunctivitis: Secondary | ICD-10-CM | POA: Insufficient documentation

## 2022-03-06 DIAGNOSIS — T1592XA Foreign body on external eye, part unspecified, left eye, initial encounter: Secondary | ICD-10-CM | POA: Insufficient documentation

## 2022-03-06 DIAGNOSIS — W228XXA Striking against or struck by other objects, initial encounter: Secondary | ICD-10-CM | POA: Insufficient documentation

## 2022-03-06 DIAGNOSIS — S0502XA Injury of conjunctiva and corneal abrasion without foreign body, left eye, initial encounter: Secondary | ICD-10-CM

## 2022-03-06 HISTORY — DX: Old myocardial infarction: I25.2

## 2022-03-06 MED ORDER — TETRACAINE HCL (PF) 0.5 % EYE DROPS
1.0000 [drp] | OPHTHALMIC | Status: AC
Start: 2022-03-06 — End: 2022-03-06
  Administered 2022-03-06: 1 [drp] via OPHTHALMIC

## 2022-03-06 MED ORDER — FLUORESCEIN 1 MG EYE STRIPS
ORAL_STRIP | OPHTHALMIC | Status: AC
Start: 2022-03-06 — End: 2022-03-06
  Filled 2022-03-06: qty 1

## 2022-03-06 MED ORDER — ERYTHROMYCIN 5 MG/GRAM (0.5 %) EYE OINTMENT
TOPICAL_OINTMENT | OPHTHALMIC | Status: AC
Start: 2022-03-06 — End: 2022-03-06
  Filled 2022-03-06: qty 1

## 2022-03-06 MED ORDER — ERYTHROMYCIN 5 MG/GRAM (0.5 %) EYE OINTMENT
TOPICAL_OINTMENT | OPHTHALMIC | Status: AC
Start: 2022-03-06 — End: 2022-03-06

## 2022-03-06 MED ORDER — FLUORESCEIN 1 MG EYE STRIPS
1.0000 | ORAL_STRIP | OPHTHALMIC | Status: AC
Start: 2022-03-06 — End: 2022-03-06
  Administered 2022-03-06: 1 via OPHTHALMIC

## 2022-03-06 MED ORDER — TETRACAINE HCL (PF) 0.5 % EYE DROPS
OPHTHALMIC | Status: AC
Start: 2022-03-06 — End: 2022-03-06
  Filled 2022-03-06: qty 80

## 2022-03-06 MED ORDER — ERYTHROMYCIN 5 MG/GRAM (0.5 %) EYE OINTMENT
TOPICAL_OINTMENT | Freq: Four times a day (QID) | OPHTHALMIC | 0 refills | Status: AC
Start: 2022-03-06 — End: ?

## 2022-03-06 NOTE — ED Provider Notes (Signed)
[facility] Emergency Department  ED Primary Provider Note  History of Present Illness   Chief Complaint   Patient presents with    Eye Pain     George Mckinney is a 71 y.o. male who had concerns including Eye Pain.  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  Patient thinks 1 of the pieces of rust or the brush hit him in the left eye.  Since then his eyes been read and burning.  Patient states it is worse when he blinks.  Patient denies any blurred or double vision.  No cloudy vision.  No severe headache.  No nausea or vomiting.  Patient has not noticed any drainage from the eye or crusting shut this morning.    HPI  Review of Systems   Review of Systems   Constitutional:  Positive for activity change. Negative for chills and fever.   HENT:  Negative for ear pain and sore throat.    Eyes:  Positive for pain, redness and itching. Negative for visual disturbance.   Respiratory:  Negative for cough and shortness of breath.    Cardiovascular:  Negative for chest pain and palpitations.   Gastrointestinal:  Negative for abdominal pain and vomiting.   Genitourinary:  Negative for dysuria and hematuria.   Musculoskeletal:  Negative for arthralgias and back pain.   Skin:  Negative for color change and rash.   Neurological:  Negative for seizures and syncope.   All other systems reviewed and are negative.   Historical Data   History Reviewed This Encounter:     Physical Exam   ED Triage Vitals [03/06/22 1100]   BP (Non-Invasive) 119/84   Heart Rate 68   Respiratory Rate 18   Temperature 36.5 C (97.7 F)   SpO2 99 %   Weight 77.1 kg (170 lb)   Height 1.829 m (6')     Physical Exam  Vitals and nursing note reviewed.   Constitutional:       General: He is not in acute distress.     Appearance: He is well-developed and normal weight.   HENT:      Head: Normocephalic and atraumatic.      Right Ear: External  ear normal.      Left Ear: External ear normal.      Mouth/Throat:      Mouth: Mucous membranes are moist.   Eyes:      Extraocular Movements: Extraocular movements intact.      Conjunctiva/sclera: Conjunctivae normal.      Pupils: Pupils are equal, round, and reactive to light.      Comments: Positive erythema left conjunctiva with foreign body medial canthus of the eye.   Cardiovascular:      Rate and Rhythm: Normal rate and regular rhythm.      Pulses: Normal pulses.      Heart sounds: Normal heart sounds. No murmur heard.  Pulmonary:      Effort: Pulmonary effort is normal. No respiratory distress.      Breath sounds: Normal breath sounds.   Abdominal:      General: Bowel sounds are normal.      Palpations: Abdomen is soft.      Tenderness: There is no abdominal tenderness.   Musculoskeletal:         General: No swelling. Normal range of motion.      Cervical  back: Normal range of motion and neck supple.   Skin:     General: Skin is warm and dry.      Capillary Refill: Capillary refill takes less than 2 seconds.   Neurological:      General: No focal deficit present.      Mental Status: He is alert and oriented to person, place, and time.   Psychiatric:         Mood and Affect: Mood normal.         Thought Content: Thought content normal.         Judgment: Judgment normal.     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  Patient states it has been sore ever since.  Patient noticed redness to the area and stated there was a foreign body on the medial aspect of the eye.  Patient denies any severe headache.  No nausea vomiting or diarrhea.  No abdominal pain.  No blurred or double vision.  Patient will have tetracaine applied to the left eye and then  gently tried to remove the fleck with a Q-tip.  Patient will then need antibiotic ointment and be given appointment with an ophthalmologist follow-up in the next 2-3 days.  I removed small piece of rust from the left medial aspect of the eye.  After staining it with 4 seen was noticed that he is got conjunctiva abrasions as well as small corneal abrasion to the left center of the eye.    Risk  Prescription drug management.             Medications Administered in the ED   tetracaine PF (PONTOCAINE) 0.5 % ophthalmic solution (1 Drop Both Eyes Given 03/06/22 1200)   fluorescein (FLUOR-I-STRIP) ophthalmic strip (1 Strip Both Eyes Given 03/06/22 1200)     Clinical Impression   Abrasion of left cornea, initial encounter (Primary)   Eye foreign body, left, initial encounter   Conjunctivitis of left eye, unspecified conjunctivitis type       Disposition: Discharged               Clinical Impression   Abrasion of left cornea, initial encounter (Primary)   Eye foreign body, left, initial encounter   Conjunctivitis of left eye, unspecified conjunctivitis type       Current Discharge Medication List        START taking these medications    Details   erythromycin (ROMYCIN) 5 mg/gram (0.5 %) Ophthalmic Ointment Apply to left eye Four times a day  Qty: 3.5 g, Refills: 0

## 2022-03-06 NOTE — ED Triage Notes (Signed)
Patient was working on riding lawnmower and piece of wire brush hit left eye. No swelling but redness to left eye.

## 2023-02-14 IMAGING — MR MRI CERVICAL SPINE WITHOUT CONTRAST
4 of 5 series · 24 of 48 positions shown · non-contrast
Comparison: None available.

﻿EXAM:  [id number]   MRI CERVICAL SPINE WITHOUT CONTRAST
INDICATION: Chronic neck pain.  Bilateral shoulder and arm pain.
TECHNIQUE: Noncontrast multiplanar, multisequence MRI was performed.

[Series 5: T2 · sagittal · 3.0mm · 0.75mm/px · 8 of 13 slices shown (1 of 2)]
[im 1/13]
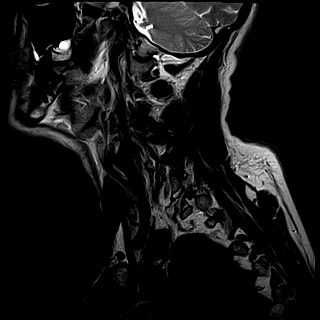
[im 2/13]
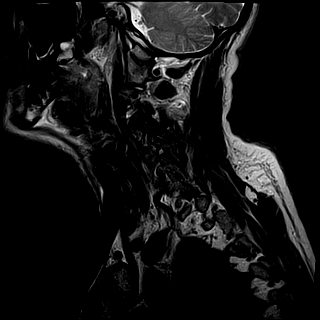
[im 4/13]
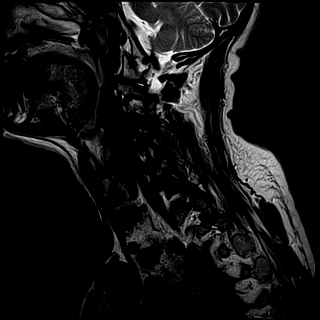
[im 6/13]
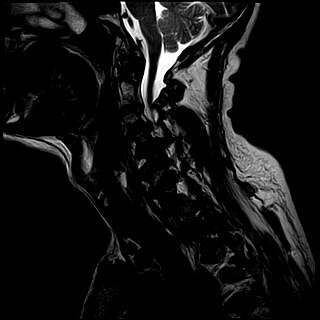
[im 7/13]
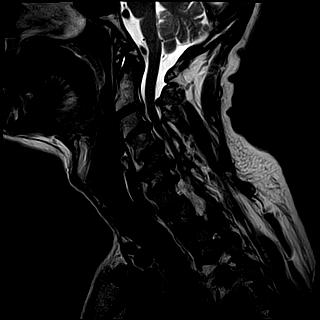
[im 9/13]
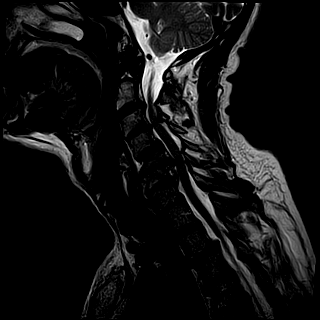
[im 11/13]
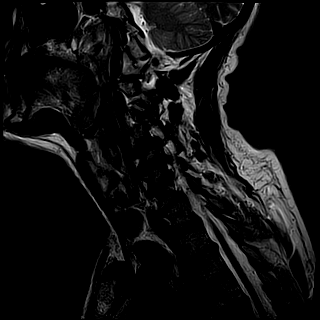
[im 13/13]
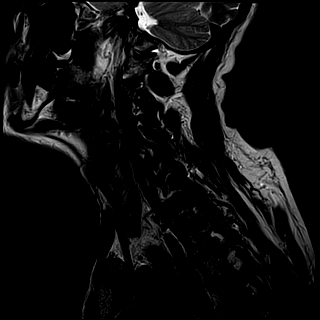

[Series 6: T1 · sagittal · 3.0mm · 0.47mm/px · 4 of 13 slices shown]
[im 1/13]
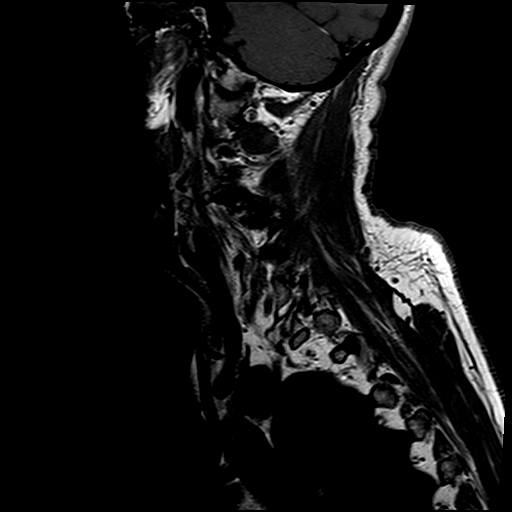
[im 2/13]
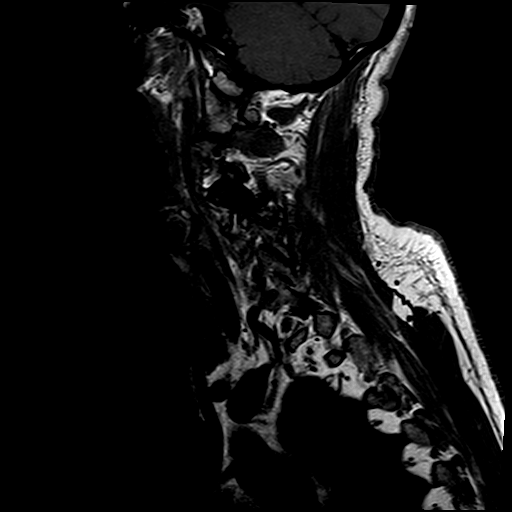
[im 7/13]
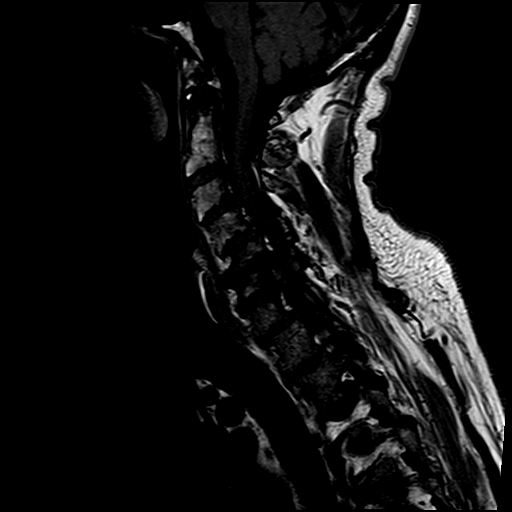
[im 11/13]
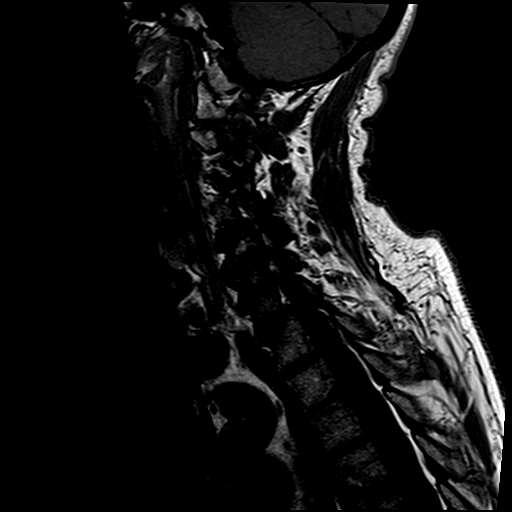

[Series 7: STIR · sagittal · 3.0mm · 0.47mm/px · 3 of 13 slices shown]
[im 2/13]
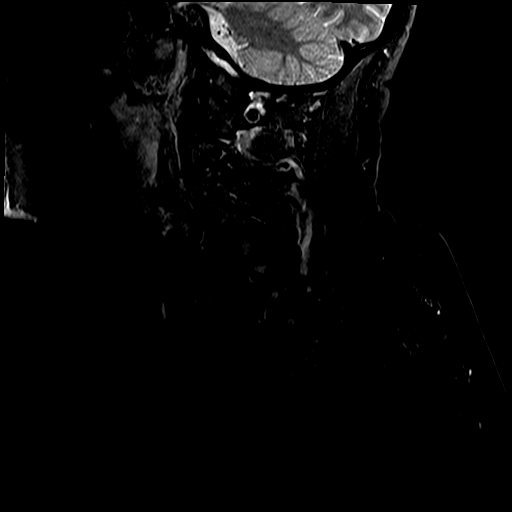
[im 7/13]
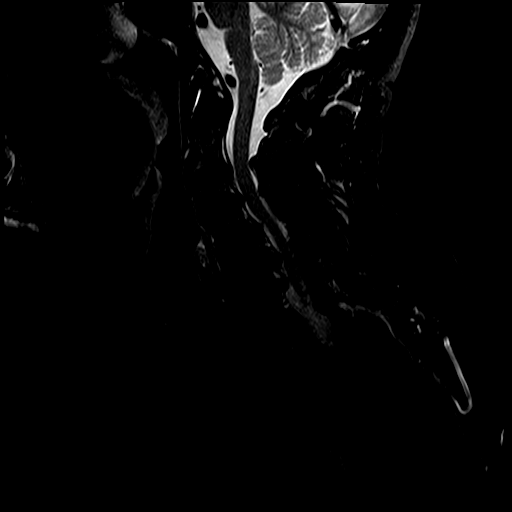
[im 11/13]
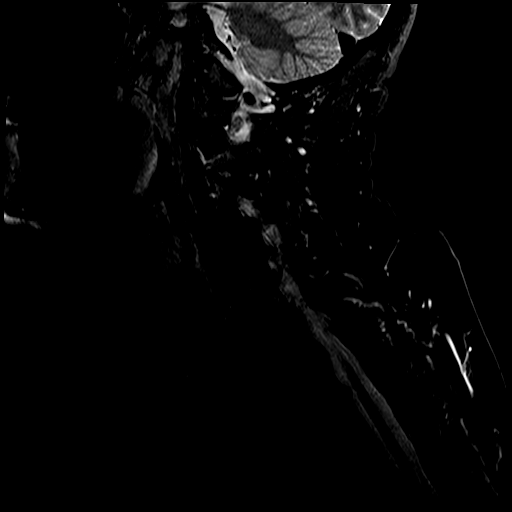

[Series 8: T2 · axial · 3.0mm · 0.39mm/px · z∈[-84,+21]mm · 9 of 18 slices shown (2 of 2)]
[im 1/18]
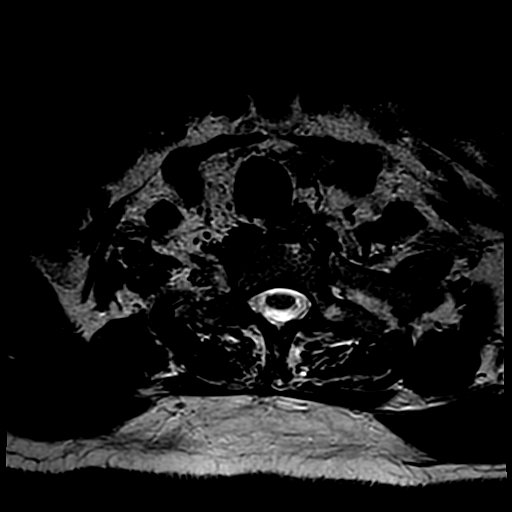
[im 4/18]
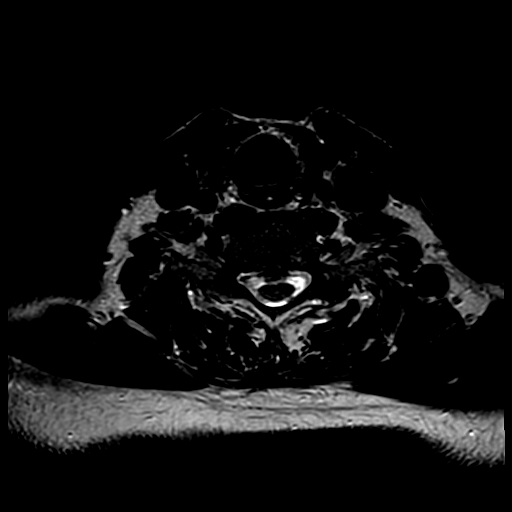
[im 5/18]
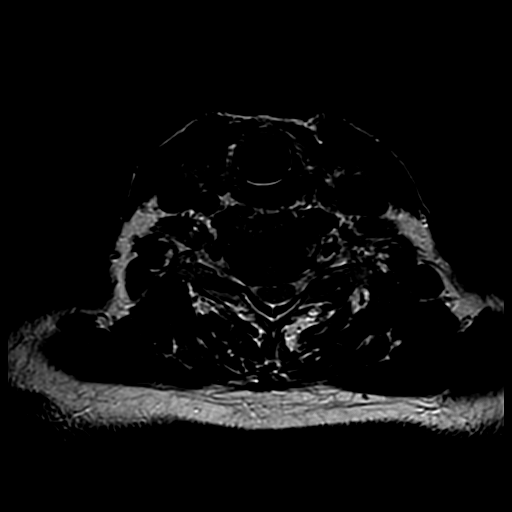
[im 8/18]
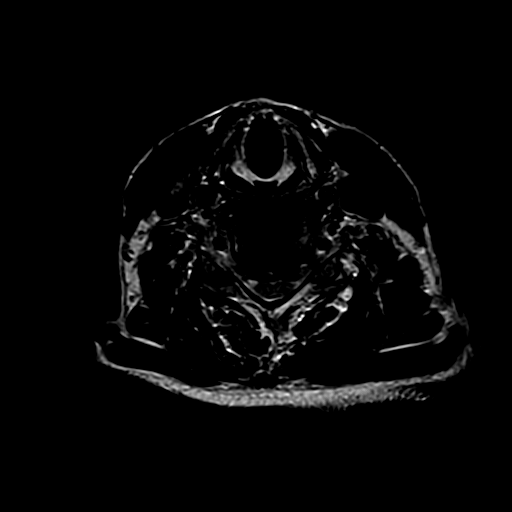
[im 10/18]
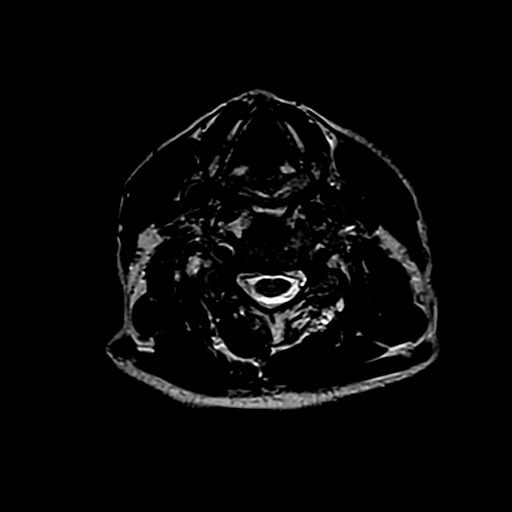
[im 13/18]
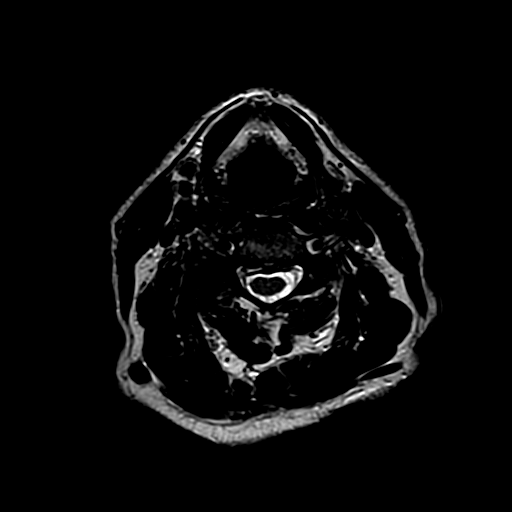
[im 14/18]
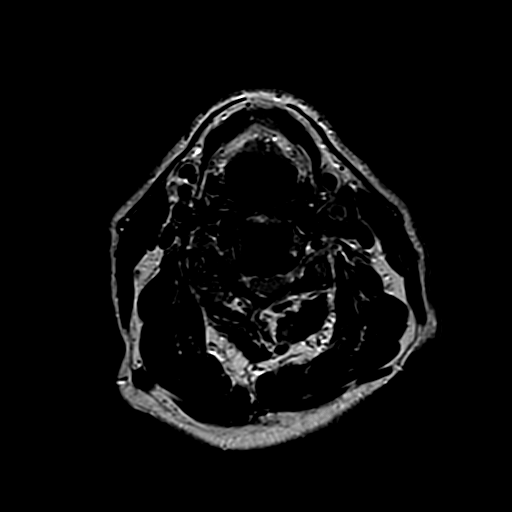
[im 16/18]
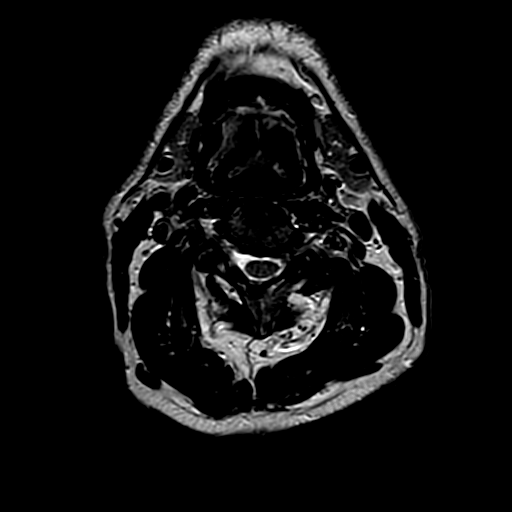
[im 18/18]
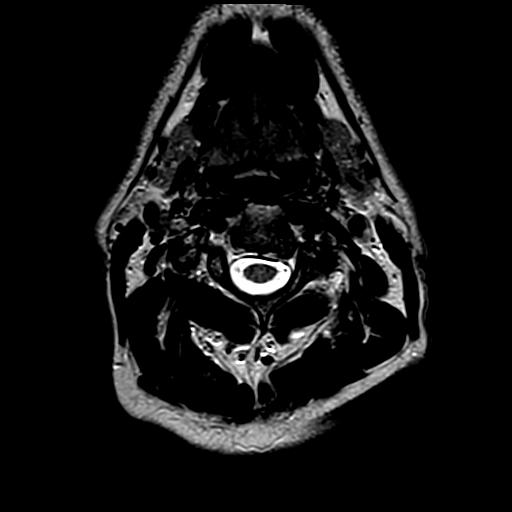

[24 of 48 positions shown; findings below may reference images not displayed]

FINDINGS: There are moderately severe degenerative changes at multiple levels with disc space narrowing and osteophyte formation.  There is reversal of the normal cervical lordosis in the lower cervical spine.  Mild subluxation is compatible with degenerative change.  There is no fracture or significant marrow signal alteration.  

There are small disc-osteophyte complexes at multiple levels including C2-C3, C3-C4, C5-C6, and C6-C7. There is associated mild multilevel spinal stenosis with compression of the thecal sac but no apparent spinal cord compression.  

The spinal cord appears unremarkable.  There is no Chiari malformation.
IMPRESSION: 1. Moderately severe degenerative changes.  

2. Mild multilevel spinal stenosis.

## 2023-07-04 ENCOUNTER — Other Ambulatory Visit: Payer: Self-pay

## 2023-07-04 ENCOUNTER — Encounter (HOSPITAL_BASED_OUTPATIENT_CLINIC_OR_DEPARTMENT_OTHER): Payer: Self-pay

## 2023-07-04 ENCOUNTER — Emergency Department (HOSPITAL_BASED_OUTPATIENT_CLINIC_OR_DEPARTMENT_OTHER): Payer: Medicare PPO

## 2023-07-04 ENCOUNTER — Emergency Department
Admission: EM | Admit: 2023-07-04 | Discharge: 2023-07-05 | Disposition: A | Discharge status: Home / Self Care | Payer: Medicare PPO | Attending: Emergency Medicine | Admitting: Emergency Medicine

## 2023-07-04 DIAGNOSIS — I440 Atrioventricular block, first degree: Secondary | ICD-10-CM | POA: Insufficient documentation

## 2023-07-04 DIAGNOSIS — R112 Nausea with vomiting, unspecified: Secondary | ICD-10-CM | POA: Insufficient documentation

## 2023-07-04 DIAGNOSIS — K529 Noninfective gastroenteritis and colitis, unspecified: Secondary | ICD-10-CM | POA: Insufficient documentation

## 2023-07-04 DIAGNOSIS — R000 Tachycardia, unspecified: Secondary | ICD-10-CM | POA: Insufficient documentation

## 2023-07-04 DIAGNOSIS — R9431 Abnormal electrocardiogram [ECG] [EKG]: Secondary | ICD-10-CM | POA: Insufficient documentation

## 2023-07-04 DIAGNOSIS — E860 Dehydration: Secondary | ICD-10-CM | POA: Insufficient documentation

## 2023-07-04 HISTORY — DX: Allergy status to unspecified drugs, medicaments and biological substances: Z88.9

## 2023-07-04 HISTORY — DX: Pure hypercholesterolemia, unspecified: E78.00

## 2023-07-04 HISTORY — DX: Gastro-esophageal reflux disease without esophagitis: K21.9

## 2023-07-04 HISTORY — DX: Essential (primary) hypertension: I10

## 2023-07-04 LAB — MANUAL DIFFERENTIAL
BAND %: 1 % — ABNORMAL LOW (ref 5–11)
BANDS NEUTROPHILS MANUAL: 1
LYMPHOCYTE %: 35 % (ref 25–45)
LYMPHOCYTE ABSOLUTE: 3.64 10*3/uL (ref 1.10–5.00)
LYMPHOCYTES MANUAL: 35
MONOCYTE %: 14 % — ABNORMAL HIGH (ref 0–12)
MONOCYTE ABSOLUTE: 1.46 10*3/uL — ABNORMAL HIGH (ref 0.00–1.30)
MONOCYTES MANUAL: 14
NEUTROPHIL %: 50 % (ref 40–76)
NEUTROPHIL ABSOLUTE: 5.3 10*3/uL (ref 1.80–8.40)
NEUTROPHILS MANUAL: 50
PLATELET MORPHOLOGY COMMENT: ADEQUATE
TOTAL CELLS COUNTED [#] IN BLOOD: 100
WBC: 10.4 10*3/uL

## 2023-07-04 LAB — CBC WITH DIFF
HCT: 50.9 % (ref 42.0–51.0)
HGB: 17.1 g/dL (ref 13.5–18.0)
MCH: 32.8 pg — ABNORMAL HIGH (ref 27.0–32.0)
MCHC: 33.6 g/dL (ref 32.0–36.0)
MCV: 97.9 fL (ref 78.0–99.0)
MPV: 8.1 fL (ref 7.4–10.4)
PLATELETS: 271 10*3/uL (ref 140–440)
RBC: 5.2 10*6/uL (ref 4.20–6.00)
RDW: 15.6 % — ABNORMAL HIGH (ref 11.6–14.8)
WBC: 10.4 10*3/uL (ref 4.0–10.5)

## 2023-07-04 LAB — LACTIC ACID LEVEL W/ REFLEX FOR LEVEL >2.0: LACTIC ACID: 2.6 mmol/L — ABNORMAL HIGH (ref 0.4–2.0)

## 2023-07-04 LAB — TROPONIN-I
TROPONIN I: 4 ng/L (ref <20)
TROPONIN I: 6 ng/L (ref <20)

## 2023-07-04 LAB — MAGNESIUM: MAGNESIUM: 2 mg/dL (ref 1.8–2.4)

## 2023-07-04 LAB — URINALYSIS, MACRO/MICRO
BILIRUBIN: NEGATIVE mg/dL
BLOOD: NEGATIVE mg/dL
GLUCOSE: NEGATIVE mg/dL
KETONES: NEGATIVE mg/dL
LEUKOCYTES: NEGATIVE WBCs/uL
NITRITE: NEGATIVE
PH: 6 (ref 4.6–8.0)
PROTEIN: NEGATIVE mg/dL
SPECIFIC GRAVITY: >=1.03 (ref 1.003–1.035)
UROBILINOGEN: 0.2 mg/dL (ref 0.2–1.0)

## 2023-07-04 LAB — PT/INR
INR: 0.95 (ref 0.84–1.10)
PROTHROMBIN TIME: 11.1 s (ref 9.8–12.7)

## 2023-07-04 LAB — COMPREHENSIVE METABOLIC PANEL, NON-FASTING
ALBUMIN/GLOBULIN RATIO: 0.9 (ref 0.8–1.4)
ALBUMIN: 3.4 g/dL (ref 3.4–5.0)
ALKALINE PHOSPHATASE: 70 U/L (ref 46–116)
ALT (SGPT): 26 U/L (ref <=78)
ANION GAP: 10 mmol/L (ref 4–13)
AST (SGOT): 29 U/L (ref 15–37)
BILIRUBIN TOTAL: 0.3 mg/dL (ref 0.2–1.0)
BUN/CREA RATIO: 10
BUN: 10 mg/dL (ref 7–18)
CALCIUM, CORRECTED: 9 mg/dL
CALCIUM: 8.5 mg/dL (ref 8.5–10.1)
CHLORIDE: 104 mmol/L (ref 98–107)
CO2 TOTAL: 27 mmol/L (ref 21–32)
CREATININE: 1.03 mg/dL (ref 0.70–1.30)
ESTIMATED GFR: 76 mL/min/{1.73_m2} (ref >59)
GLOBULIN: 3.6
GLUCOSE: 127 mg/dL — ABNORMAL HIGH (ref 74–106)
OSMOLALITY, CALCULATED: 282 mosm/kg (ref 270–290)
POTASSIUM: 4.5 mmol/L (ref 3.5–5.1)
PROTEIN TOTAL: 7 g/dL (ref 6.4–8.2)
SODIUM: 141 mmol/L (ref 136–145)

## 2023-07-04 LAB — POC BLOOD GLUCOSE (RESULTS): GLUCOSE, POC: 132 mg/dL — ABNORMAL HIGH (ref 70–100)

## 2023-07-04 LAB — PTT (PARTIAL THROMBOPLASTIN TIME): APTT: 27.1 s (ref 25.0–38.0)

## 2023-07-04 LAB — LIPASE: LIPASE: 31 U/L (ref 16–77)

## 2023-07-04 MED ORDER — PROCHLORPERAZINE EDISYLATE 10 MG/2 ML (5 MG/ML) INJECTION SOLUTION
10.0000 mg | INTRAMUSCULAR | Status: AC
Start: 2023-07-04 — End: 2023-07-04
  Administered 2023-07-04: 10 mg via INTRAVENOUS

## 2023-07-04 MED ORDER — PROCHLORPERAZINE EDISYLATE 10 MG/2 ML (5 MG/ML) INJECTION SOLUTION
INTRAMUSCULAR | Status: AC
Start: 2023-07-04 — End: 2023-07-04
  Filled 2023-07-04: qty 2

## 2023-07-04 MED ORDER — LACTATED RINGERS IV BOLUS
1000.0000 mL | INJECTION | Status: AC
Start: 2023-07-04 — End: 2023-07-04
  Administered 2023-07-04: 0 mL via INTRAVENOUS
  Administered 2023-07-04: 1000 mL via INTRAVENOUS

## 2023-07-04 MED ORDER — PANTOPRAZOLE 40 MG INTRAVENOUS SOLUTION
40.0000 mg | INTRAVENOUS | Status: AC
Start: 2023-07-04 — End: 2023-07-04
  Administered 2023-07-04: 40 mg via INTRAVENOUS

## 2023-07-04 MED ORDER — LACTATED RINGERS IV BOLUS
1000.0000 mL | INJECTION | Status: AC
Start: 2023-07-04 — End: 2023-07-04
  Administered 2023-07-04: 1000 mL via INTRAVENOUS
  Administered 2023-07-04: 0 mL via INTRAVENOUS

## 2023-07-04 MED ORDER — DIPHENHYDRAMINE 50 MG/ML INJECTION SOLUTION
25.0000 mg | INTRAMUSCULAR | Status: AC
Start: 2023-07-04 — End: 2023-07-04
  Administered 2023-07-04: 25 mg via INTRAVENOUS

## 2023-07-04 MED ORDER — PANTOPRAZOLE 40 MG INTRAVENOUS SOLUTION
INTRAVENOUS | Status: AC
Start: 2023-07-04 — End: 2023-07-04
  Filled 2023-07-04: qty 10

## 2023-07-04 MED ORDER — DIPHENHYDRAMINE 50 MG/ML INJECTION SOLUTION
INTRAMUSCULAR | Status: AC
Start: 2023-07-04 — End: 2023-07-04
  Filled 2023-07-04: qty 1

## 2023-07-04 NOTE — ED Triage Notes (Signed)
Pt reports X 45 minutes ago sudden onset of vomiting (X 4-5) and blurred vision. Pt denies CP or SOB . 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 + dizziness, denies syncope

## 2023-07-04 NOTE — ED Nurses Note (Signed)
Pt reports he is feeling much better at this time. Denies any wants or needs, no s/s of acute distress noted at this time.

## 2023-07-04 NOTE — ED Nurses Note (Signed)
Pt c/o nausea, vomiting, and blurred vision that began about 45 min PTA. Pt states he ate at local restaurant around 1800 this evening and then suddenly began feeling nauseous and vomiting. Pt denies abdominal pain, denies diarrhea, denies chest pain. Pt does state he is short of breath, however, no more than normal. Pt does have cardiac hx. Pt placed on monitor, HOB elevated, no s/s of acute distress noted at this time.

## 2023-07-04 NOTE — ED Provider Notes (Addendum)
[facility] emergency department         HISTORY OF PRESENT ILLNESS     Date:  07/04/2023  Patient's Name:  George Mckinney  Date of Birth:  [birth year]    PATIENT IS A 72-YEAR-OLD MALE PRESENTING ACUTELY TO THE ER WITH NAUSEA AND VOMITING.  PATIENT STATES ABOUT 30 MINUTES AGO HE WAS AT HOME AND HAD SUDDEN ONSET OF DIZZINESS NAUSEA AND BEGAN VOMITING.  HE DENIED ANY CHEST PAIN FEVER OR CHILLS.  HE DID EAT FAST FOOD EARLIER THIS AFTERNOON.  HE DENIED ANY DIARRHEA.  HE COMPLAINED OF A HEADACHE WITH DIZZINESS AND BLURRED VISION.  HE DENIED ANY NECK PAIN OR RASH.  ON PRESENTATION TO THE ER PATIENT USES ALL HIS EXTREMITIES DEER NO NEUROLOGICAL DEFICITS ON HIS EXAM HE IS AWAKE AND ALERT.  PATIENT STATES HE VOMITED AT LEAST 6 TIMES PRIOR TO COMING TO THE ER        Review of Systems     Review of Systems   Constitutional: Negative.    HENT: Negative.     Eyes: Negative.    Respiratory: Negative.     Gastrointestinal:  Positive for nausea and vomiting.   Musculoskeletal: Negative.    Skin: Negative.    Neurological:  Positive for dizziness and headaches.   Psychiatric/Behavioral: Negative.     All other systems reviewed and are negative.      Previous History     Past Medical History:  Past Medical History:   Diagnosis Date    GERD (gastroesophageal reflux disease)     H/O seasonal allergies     High cholesterol     HTN (hypertension)     MI, old        Past Surgical History:  Past Surgical History:   Procedure Laterality Date    Hx hernia repair         Social History:  Social History     Tobacco Use    Smoking status: Never    Smokeless tobacco: Never   Substance Use Topics    Alcohol use: Never    Drug use: Never     Social History     Substance and Sexual Activity   Drug Use Never       Family History:  No family history on file.    Medication History:  Current Outpatient Medications   Medication Sig    albuterol sulfate (PROVENTIL OR VENTOLIN OR PROAIR) 90 mcg/actuation Inhalation  oral inhaler Take 1-2 Puffs by inhalation Every 6 hours as needed    aspirin 81 mg Oral Tablet, Chewable Chew 1 Tablet (81 mg total) Once a day    atorvastatin (LIPITOR) 80 mg Oral Tablet Take 1 Tablet (80 mg total) by mouth Every evening    celecoxib (CELEBREX) 200 mg Oral Capsule Take 1 Capsule (200 mg total) by mouth Twice daily    cetirizine (ZYRTEC) 10 mg Oral Tablet Take 1 Tablet (10 mg total) by mouth Once a day    erythromycin (ROMYCIN) 5 mg/gram (0.5 %) Ophthalmic Ointment Apply to left eye Four times a day    famotidine (PEPCID) 20 mg Oral Tablet Take 1 Tablet (20 mg total) by mouth Twice daily    metoprolol succinate (TOPROL-XL) 25 mg Oral Tablet Sustained Release 24 hr Take 1 Tablet (25 mg total) by mouth Once a day    ondansetron (ZOFRAN ODT) 4 mg Oral Tablet, Rapid Dissolve Take 1 Tablet (4 mg total) by mouth Every  8 hours as needed for Nausea/Vomiting    tamsulosin (FLOMAX) 0.4 mg Oral Capsule Take 1 Capsule (0.4 mg total) by mouth Every evening after dinner    terbinafine HCL (LAMISIL) 250 mg Oral Tablet Take 1 Tablet (250 mg total) by mouth Once a day    ticagrelor (BRILINTA) 90 mg Oral Tablet Take by mouth    traMADoL (ULTRAM) 50 mg Oral Tablet Take by mouth Every 6 hours as needed for Pain    zolpidem (AMBIEN) 5 mg Oral Tablet Take 1 Tablet (5 mg total) by mouth Every night as needed for Insomnia       Allergies:  No Known Allergies    Physical Exam     Vitals:    BP 129/83   Pulse 65   Temp 36.5 C (97.7 F)   Resp 20   Ht 1.829 m (6')   Wt 83.9 kg (185 lb)   SpO2 98%   BMI 25.09 kg/m           Physical Exam  Vitals and nursing note reviewed. Exam conducted with a chaperone present.   Constitutional:       General: He is in acute distress.      Appearance: Normal appearance. He is ill-appearing.   HENT:      Head: Normocephalic and atraumatic.      Right Ear: Tympanic membrane normal.      Left Ear: Tympanic membrane normal.      Nose: Nose normal.      Mouth/Throat:      Mouth: Mucous  membranes are moist.   Eyes:      Pupils: Pupils are equal, round, and reactive to light.   Cardiovascular:      Rate and Rhythm: Tachycardia present.   Pulmonary:      Effort: Pulmonary effort is normal.   Abdominal:      General: Bowel sounds are normal.   Musculoskeletal:         General: Normal range of motion.      Cervical back: Normal range of motion.   Skin:     General: Skin is warm.   Neurological:      General: No focal deficit present.      Mental Status: He is alert and oriented to person, place, and time.   Psychiatric:         Mood and Affect: Mood normal.         Diagnostic Studies/Treatment     Medications:  Medications Administered in the ED   ondansetron (ZOFRAN ODT) rapid dissolve tablet (has no administration in time range)   LR bolus infusion 1,000 mL (0 mL Intravenous Stopped 07/04/23 2229)   LR bolus infusion 1,000 mL (0 mL Intravenous Stopped 07/04/23 2311)   prochlorperazine (COMPAZINE) 5 mg/mL injection (10 mg Intravenous Given 07/04/23 2159)   pantoprazole (PROTONIX) 4 mg/mL injection (40 mg Intravenous Given 07/04/23 2159)   diphenhydrAMINE (BENADRYL) 50 mg/mL injection (25 mg Intravenous Given 07/04/23 2201)       New Prescriptions    ONDANSETRON (ZOFRAN ODT) 4 MG ORAL TABLET, RAPID DISSOLVE    Take 1 Tablet (4 mg total) by mouth Every 8 hours as needed for Nausea/Vomiting       Labs:    Results for orders placed or performed during the hospital encounter of 07/04/23 (from the past 12 hour(s))   POC BLOOD GLUCOSE (RESULTS)   Result Value Ref Range    GLUCOSE, POC 132 (H) 70 - 100 mg/dl  TROPONIN NOW   Result Value Ref Range    TROPONIN I 4 <20 ng/L   COMPREHENSIVE METABOLIC PANEL, NON-FASTING   Result Value Ref Range    SODIUM 141 136 - 145 mmol/L    POTASSIUM 4.5 3.5 - 5.1 mmol/L    CHLORIDE 104 98 - 107 mmol/L    CO2 TOTAL 27 21 - 32 mmol/L    ANION GAP 10 4 - 13 mmol/L    BUN 10 7 - 18 mg/dL    CREATININE 3.24 4.01 - 1.30 mg/dL    BUN/CREA RATIO 10     ESTIMATED GFR 76 >59 mL/min/1.38m^2     ALBUMIN 3.4 3.4 - 5.0 g/dL    CALCIUM 8.5 8.5 - 02.7 mg/dL    GLUCOSE 253 (H) 74 - 106 mg/dL    ALKALINE PHOSPHATASE 70 46 - 116 U/L    ALT (SGPT) 26 <=78 U/L    AST (SGOT) 29 15 - 37 U/L    BILIRUBIN TOTAL 0.3 0.2 - 1.0 mg/dL    PROTEIN TOTAL 7.0 6.4 - 8.2 g/dL    ALBUMIN/GLOBULIN RATIO 0.9 0.8 - 1.4    OSMOLALITY, CALCULATED 282 270 - 290 mOsm/kg    CALCIUM, CORRECTED 9.0 mg/dL    GLOBULIN 3.6    LIPASE   Result Value Ref Range    LIPASE 31 16 - 77 U/L   LACTIC ACID LEVEL W/ REFLEX FOR LEVEL >2.0   Result Value Ref Range    LACTIC ACID 2.6 (H) 0.4 - 2.0 mmol/L   MAGNESIUM   Result Value Ref Range    MAGNESIUM 2.0 1.8 - 2.4 mg/dL   PT/INR   Result Value Ref Range    PROTHROMBIN TIME 11.1 9.8 - 12.7 seconds    INR 0.95 0.84 - 1.10   PTT (PARTIAL THROMBOPLASTIN TIME)   Result Value Ref Range    APTT 27.1 25.0 - 38.0 seconds   CBC WITH DIFF   Result Value Ref Range    WBC 10.4 4.0 - 10.5 x10^3/uL    RBC 5.20 4.20 - 6.00 x10^6/uL    HGB 17.1 13.5 - 18.0 g/dL    HCT 66.4 40.3 - 47.4 %    MCV 97.9 78.0 - 99.0 fL    MCH 32.8 (H) 27.0 - 32.0 pg    MCHC 33.6 32.0 - 36.0 g/dL    RDW 25.9 (H) 56.3 - 14.8 %    PLATELETS 271 140 - 440 x10^3/uL    MPV 8.1 7.4 - 10.4 fL   MANUAL DIFFERENTIAL   Result Value Ref Range    WBC 10.4 x10^3/uL    NEUTROPHIL % 50 40 - 76 %    LYMPHOCYTE % 35 25 - 45 %    MONOCYTE % 14 (H) 0 - 12 %    EOSINOPHIL %      BASOPHIL %      METAMYELOCYTE %      MYELOCYTE %      PROMYELOCYTE %      BAND % 1 (L) 5 - 11 %    BLAST %      OTHER %      NEUTROPHIL ABSOLUTE 5.30 1.80 - 8.40 x10^3/uL    LYMPHOCYTE ABSOLUTE 3.64 1.10 - 5.00 x10^3/uL    MONOCYTE ABSOLUTE 1.46 (H) 0.00 - 1.30 x10^3/uL    EOSINOPHIL ABSOLUTE      BASOPHIL ABSOLUTE      METAMYELOCYTE ABSOLUTE      MYELOCYTE ABSOLUTE  PROMYELOCYTE ABSOLUTE      BLAST ABSOLUTE      OTHER CELL ABSOLUTE      ANISOCYTOSIS 1+ (10-25%)     POLYCHROMASIA      POIKILOCYTOSIS 1+ (10-25%)     BASOPHILIC STIPPLING      MICROCYTOSIS      MACROCYTOSIS       ROULEAUX      SCHISTOCYTES      SPHEROCYTES      TARGET CELLS      TEARDROP CELLS      OVALOCYTE (ELLIPTOCYTE)      CRENATED RED CELLS      STOMATOCYTES      ACANTHOCYTES (SPUR CELL)      ECHINOCYTE (BURR CELL)      BLISTER CELLS      RBC AGGLUTINATES      HOWELL JOLLY BODIES      ATYPICAL LYMPHOCYTES      TOXIC GRANULATION      DOHLE BODIES      TOXIC VACUOLIZATION      AUER RODS      BASKET CELLS      HYPERSEGMENTATION      LARGE PLATELETS      PLATELET CLUMPS      PLATELET MORPHOLOGY COMMENT Adequate     BANDS NEUTROPHILS MANUAL 1     BAND ABSOLUTE      NEUTROPHILS MANUAL 50     LYMPHOCYTES MANUAL 35     MONOCYTES MANUAL 14     EOSINOPHILS MANUAL      BASOPHILS MANUAL      PROMYELOCYTES MANUAL      MYELOCYTES MANUAL      METAMYELOCYTES MANUAL      BLASTS MANUAL      TOTAL CELLS COUNTED [#] IN BLOOD 100     OTHER CELLS MANUAL      NUCLEATED RBC MANUAL      PLASMA CELL %      PLASMA CELL ABSOLUE      PLASMA CELLS MANUAL      HYPOCHROMASIA     URINALYSIS, MACRO/MICRO   Result Value Ref Range    COLOR Light Yellow Light Yellow, Yellow    APPEARANCE Clear Clear    SPECIFIC GRAVITY >=1.030 1.003 - 1.035    PH 6.0 4.6 - 8.0    LEUKOCYTES Negative Negative WBCs/uL    NITRITE Negative Negative    PROTEIN Negative Negative mg/dL    GLUCOSE Negative Negative mg/dL    KETONES Negative Negative mg/dL    BILIRUBIN Negative Negative mg/dL    BLOOD Negative Negative mg/dL    UROBILINOGEN 0.2 0.2 - 1.0 mg/dL   TROPONIN IN ONE HOUR   Result Value Ref Range    TROPONIN I 6 <20 ng/L   LACTIC ACID - FIRST REFLEX   Result Value Ref Range    LACTIC ACID 1.9 0.4 - 2.0 mmol/L   TROPONIN IN THREE HOURS   Result Value Ref Range    TROPONIN I 10 <20 ng/L        Radiology:  XR CHEST AP  CT BRAIN WO IV CONTRAST  CT ABDOMEN PELVIS WO IV CONTRAST    CT BRAIN WO IV CONTRAST   Final Result   No acute ischemia, bleed, or hydrocephalus   There is a soft tissue mass lateral to the right cavernous sinus at the skull base. Very common place for  meningioma to occur. Nonemergent MRI including post contrast imaging recommended for further workup in investigation  One or more dose reduction techniques were used (e.g., Automated exposure control, adjustment of the mA and/or kV according to patient size, use of iterative reconstruction technique).         Radiologist location ID: [id number]         CT ABDOMEN PELVIS WO IV CONTRAST   Final Result   NO ACUTE FINDINGS AT THE ABDOMEN OR PELVIS ON NONCONTRAST CT.          One or more dose reduction techniques were used (e.g., Automated exposure control, adjustment of the mA and/or kV according to patient size, use of iterative reconstruction technique).         Radiologist location ID: [id number]         XR CHEST AP   Final Result   Interstitial prominence, interstitial pneumonia is favored over pulmonary edema on this exam            Radiologist location ID: [id number]             ECG:   NORMAL SINUS RHYTHM FIRST-DEGREE AV BLOCK 76 BEATS PER MINUTE PR INTERVAL 2-0 QRS DURATION 118 NONSPECIFIC ST-T CHANGES            Differential diagnosis  1. NAUSEA AND VOMITING 2.  DEHYDRATION 3. GASTROENTERITIS 4. GASTRITIS PATIENT     Course/Disposition/Plan     Course:     ED Course as of 07/05/23 0117   Thu Jul 05, 2023   0020 PATIENT IS MUCH IMPROVED TOLERATED ICE CHIPS AND FLUIDS 6 OZ WITHOUT ANY RECURRENT VOMITING.  PATIENT STATES THE DIZZINESS HEADACHE HAS RESOLVED   PATIENT RECEIVING IV FLUIDS NPO WILL OBTAIN SERIAL TROPONINS.  PATIENT BASELINE LABS OBTAIN PATIENT TREATED WITH FLUIDS AND IV ANTI EMETICS  Disposition:    Discharged    Condition at Disposition:   STABLE    Follow up:   Richardean Chimera, DO  [address]  [place] [address]  [phone]    Schedule an appointment as soon as possible for a visit in 2 days  If symptoms worsen      Clinical Impression:     Clinical Impression   Nausea and vomiting (Primary)   Gastroenteritis   Dehydration         Tivis Ringer, MD

## 2023-07-04 NOTE — ED Nurses Note (Signed)
Pt up walking hallway with this nurse per provider; pt reports he is no longer having blurred vision or dizziness. Pt states "I feel pretty good now but my head feels a little swimmy." Pt walked with steady gait w/o any issues. Provider notified.

## 2023-07-05 DIAGNOSIS — R42 Dizziness and giddiness: Secondary | ICD-10-CM

## 2023-07-05 DIAGNOSIS — I4589 Other specified conduction disorders: Secondary | ICD-10-CM

## 2023-07-05 DIAGNOSIS — R112 Nausea with vomiting, unspecified: Secondary | ICD-10-CM

## 2023-07-05 DIAGNOSIS — I440 Atrioventricular block, first degree: Secondary | ICD-10-CM

## 2023-07-05 LAB — LACTIC ACID - FIRST REFLEX: LACTIC ACID: 1.9 mmol/L (ref 0.4–2.0)

## 2023-07-05 LAB — ECG 12 LEAD
Atrial Rate: 76 {beats}/min
Calculated P Axis: 56 degrees
Calculated R Axis: -40 degrees
Calculated T Axis: 80 degrees
PR Interval: 220 ms
QRS Duration: 118 ms
QT Interval: 406 ms
QTC Calculation: 456 ms
Ventricular rate: 76 {beats}/min

## 2023-07-05 LAB — TROPONIN-I: TROPONIN I: 10 ng/L (ref <20)

## 2023-07-05 MED ORDER — ONDANSETRON 4 MG DISINTEGRATING TABLET
4.0000 mg | ORAL_TABLET | ORAL | Status: AC
Start: 2023-07-05 — End: 2023-07-05
  Administered 2023-07-05: 4 mg via ORAL

## 2023-07-05 MED ORDER — ONDANSETRON 4 MG DISINTEGRATING TABLET
ORAL_TABLET | ORAL | Status: AC
Start: 2023-07-05 — End: 2023-07-05
  Filled 2023-07-05: qty 1

## 2023-07-05 MED ORDER — ONDANSETRON 4 MG DISINTEGRATING TABLET
4.0000 mg | ORAL_TABLET | Freq: Three times a day (TID) | ORAL | 0 refills | Status: AC | PRN
Start: 2023-07-05 — End: ?

## 2023-07-05 NOTE — Discharge Instructions (Signed)
CLEAR LIQUIDS FOR THE NEXT 8-12 HOURS

## 2023-07-05 NOTE — ED Nurses Note (Signed)
Patient discharged home with family.  AVS reviewed with patient/care giver.  A written copy of the AVS and discharge instructions was given to the patient/care giver. Scripts handed to patient/care giver. Questions sufficiently answered as needed.  Patient/care giver encouraged to follow up with PCP as indicated.  In the event of an emergency, patient/care giver instructed to call 911 or go to the nearest emergency room. Pt verbalized understanding of discharge instructions. Denies any questions or concerns at this time. Respirations even and unlabored w/ no s/s of acute distress noted at this time. Pt seen walking out of ED with steady gait w/ spouse.

## 2023-08-14 ENCOUNTER — Other Ambulatory Visit (HOSPITAL_COMMUNITY): Payer: Self-pay | Admitting: PHYSICAL MEDICINE AND REHABILITATION

## 2023-08-14 DIAGNOSIS — I2510 Atherosclerotic heart disease of native coronary artery without angina pectoris: Secondary | ICD-10-CM

## 2023-08-28 ENCOUNTER — Other Ambulatory Visit: Payer: Self-pay

## 2023-08-28 ENCOUNTER — Ambulatory Visit
Admission: RE | Admit: 2023-08-28 | Discharge: 2023-08-28 | Disposition: A | Discharge status: Home / Self Care | Payer: Non-veteran care | Source: Ambulatory Visit | Attending: PHYSICAL MEDICINE AND REHABILITATION | Admitting: PHYSICAL MEDICINE AND REHABILITATION

## 2023-08-28 DIAGNOSIS — I2510 Atherosclerotic heart disease of native coronary artery without angina pectoris: Secondary | ICD-10-CM | POA: Insufficient documentation
